# Patient Record
Sex: Female | Born: 1970
Health system: Southern US, Community
[De-identification: ages and names within clinical notes are randomized; demographics above are authoritative.]

## PROBLEM LIST (undated history)

## (undated) DIAGNOSIS — Z1371 Encounter for nonprocreative screening for genetic disease carrier status: Secondary | ICD-10-CM

## (undated) DIAGNOSIS — D352 Benign neoplasm of pituitary gland: Secondary | ICD-10-CM

## (undated) DIAGNOSIS — E221 Hyperprolactinemia: Secondary | ICD-10-CM

## (undated) DIAGNOSIS — Z803 Family history of malignant neoplasm of breast: Secondary | ICD-10-CM

## (undated) DIAGNOSIS — Z836 Family history of other diseases of the respiratory system: Secondary | ICD-10-CM

## (undated) DIAGNOSIS — K219 Gastro-esophageal reflux disease without esophagitis: Secondary | ICD-10-CM

## (undated) DIAGNOSIS — I10 Essential (primary) hypertension: Secondary | ICD-10-CM

## (undated) DIAGNOSIS — S2220XA Unspecified fracture of sternum, initial encounter for closed fracture: Secondary | ICD-10-CM

## (undated) HISTORY — PX: TUBAL LIGATION: SHX77

## (undated) HISTORY — DX: Family history of malignant neoplasm of breast: Z80.3

## (undated) HISTORY — DX: Gastro-esophageal reflux disease without esophagitis: K21.9

---
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
## 2001-02-17 ENCOUNTER — Emergency Department (HOSPITAL_COMMUNITY): Admission: EM | Admit: 2001-02-17 | Discharge: 2001-02-17 | Payer: Self-pay | Admitting: *Deleted

## 2001-07-15 ENCOUNTER — Emergency Department (HOSPITAL_COMMUNITY): Admission: EM | Admit: 2001-07-15 | Discharge: 2001-07-16 | Payer: Self-pay | Admitting: Emergency Medicine

## 2001-07-15 ENCOUNTER — Encounter: Payer: Self-pay | Admitting: Emergency Medicine

## 2001-12-05 ENCOUNTER — Encounter: Admission: RE | Admit: 2001-12-05 | Discharge: 2001-12-05 | Payer: Self-pay | Admitting: Cardiology

## 2001-12-05 ENCOUNTER — Encounter: Payer: Self-pay | Admitting: Cardiology

## 2003-07-02 ENCOUNTER — Encounter: Admission: RE | Admit: 2003-07-02 | Discharge: 2003-07-02 | Payer: Self-pay | Admitting: Cardiology

## 2003-09-26 ENCOUNTER — Emergency Department (HOSPITAL_COMMUNITY): Admission: EM | Admit: 2003-09-26 | Discharge: 2003-09-26 | Payer: Self-pay

## 2003-09-27 ENCOUNTER — Emergency Department (HOSPITAL_COMMUNITY): Admission: EM | Admit: 2003-09-27 | Discharge: 2003-09-27 | Payer: Self-pay

## 2004-05-10 ENCOUNTER — Emergency Department (HOSPITAL_COMMUNITY): Admission: EM | Admit: 2004-05-10 | Discharge: 2004-05-10 | Payer: Self-pay | Admitting: Family Medicine

## 2004-09-05 ENCOUNTER — Encounter: Admission: RE | Admit: 2004-09-05 | Discharge: 2004-09-05 | Payer: Self-pay | Admitting: Obstetrics

## 2006-12-22 ENCOUNTER — Emergency Department (HOSPITAL_COMMUNITY): Admission: EM | Admit: 2006-12-22 | Discharge: 2006-12-22 | Payer: Self-pay | Admitting: Family Medicine

## 2007-01-29 ENCOUNTER — Emergency Department (HOSPITAL_COMMUNITY): Admission: EM | Admit: 2007-01-29 | Discharge: 2007-01-29 | Payer: Self-pay | Admitting: Emergency Medicine

## 2008-02-03 ENCOUNTER — Ambulatory Visit: Payer: Self-pay | Admitting: Gastroenterology

## 2008-02-03 ENCOUNTER — Encounter: Admission: RE | Admit: 2008-02-03 | Discharge: 2008-02-03 | Payer: Self-pay | Admitting: Internal Medicine

## 2008-02-03 DIAGNOSIS — K625 Hemorrhage of anus and rectum: Secondary | ICD-10-CM | POA: Insufficient documentation

## 2008-02-03 DIAGNOSIS — K5900 Constipation, unspecified: Secondary | ICD-10-CM | POA: Insufficient documentation

## 2008-02-04 LAB — CONVERTED CEMR LAB
ALT: 16 U/L
AST: 18 U/L
Albumin: 3.7 g/dL
Alkaline Phosphatase: 55 U/L
BUN: 14 mg/dL
Basophils Absolute: 0.1 10*3/uL
Basophils Relative: 1.6 %
CO2: 37 meq/L — ABNORMAL HIGH
Calcium: 8.8 mg/dL
Chloride: 98 meq/L
Creatinine, Ser: 1.1 mg/dL
Eosinophils Absolute: 0.1 10*3/uL
Eosinophils Relative: 1.2 %
GFR calc Af Amer: 71 mL/min
GFR calc non Af Amer: 59 mL/min
Glucose, Bld: 98 mg/dL
HCT: 35 % — ABNORMAL LOW
Hemoglobin: 11.7 g/dL — ABNORMAL LOW
Lymphocytes Relative: 35.5 %
MCHC: 33.5 g/dL
MCV: 90.2 fL
Monocytes Absolute: 0.6 10*3/uL
Monocytes Relative: 7.1 %
Neutro Abs: 4.3 10*3/uL
Neutrophils Relative %: 54.6 %
Platelets: 372 10*3/uL
Potassium: 3.4 meq/L — ABNORMAL LOW
RBC: 3.88 M/uL
RDW: 12.7 %
Sodium: 141 meq/L
TSH: 0.92 u[IU]/mL
Total Bilirubin: 0.5 mg/dL
Total Protein: 7.2 g/dL
WBC: 7.9 10*3/uL

## 2008-02-07 ENCOUNTER — Encounter: Payer: Self-pay | Admitting: Gastroenterology

## 2008-02-07 ENCOUNTER — Ambulatory Visit: Payer: Self-pay | Admitting: Gastroenterology

## 2008-02-11 ENCOUNTER — Encounter: Payer: Self-pay | Admitting: Gastroenterology

## 2010-03-09 ENCOUNTER — Ambulatory Visit (HOSPITAL_BASED_OUTPATIENT_CLINIC_OR_DEPARTMENT_OTHER)
Admission: RE | Admit: 2010-03-09 | Discharge: 2010-03-09 | Payer: Self-pay | Source: Home / Self Care | Admitting: Internal Medicine

## 2010-03-13 ENCOUNTER — Ambulatory Visit: Payer: Self-pay | Admitting: Internal Medicine

## 2010-03-25 ENCOUNTER — Emergency Department (HOSPITAL_COMMUNITY)
Admission: EM | Admit: 2010-03-25 | Discharge: 2010-03-25 | Payer: Self-pay | Source: Home / Self Care | Admitting: Family Medicine

## 2010-07-31 ENCOUNTER — Encounter: Payer: Self-pay | Admitting: Orthopaedic Surgery

## 2010-08-01 ENCOUNTER — Encounter: Payer: Self-pay | Admitting: Internal Medicine

## 2010-08-19 ENCOUNTER — Inpatient Hospital Stay (INDEPENDENT_AMBULATORY_CARE_PROVIDER_SITE_OTHER)
Admission: RE | Admit: 2010-08-19 | Discharge: 2010-08-19 | Disposition: A | Discharge status: Home / Self Care | Payer: Self-pay | Source: Ambulatory Visit | Attending: Family Medicine | Admitting: Family Medicine

## 2010-08-19 DIAGNOSIS — K2990 Gastroduodenitis, unspecified, without bleeding: Secondary | ICD-10-CM

## 2010-08-19 DIAGNOSIS — I10 Essential (primary) hypertension: Secondary | ICD-10-CM

## 2010-08-19 DIAGNOSIS — K2970 Gastritis, unspecified, without bleeding: Secondary | ICD-10-CM

## 2010-08-22 ENCOUNTER — Inpatient Hospital Stay (INDEPENDENT_AMBULATORY_CARE_PROVIDER_SITE_OTHER)
Admission: RE | Admit: 2010-08-22 | Discharge: 2010-08-22 | Disposition: A | Discharge status: Home / Self Care | Payer: Self-pay | Source: Ambulatory Visit | Attending: Emergency Medicine | Admitting: Emergency Medicine

## 2010-08-22 DIAGNOSIS — N898 Other specified noninflammatory disorders of vagina: Secondary | ICD-10-CM

## 2010-08-22 LAB — POCT URINALYSIS DIPSTICK
Bilirubin Urine: NEGATIVE
Nitrite: NEGATIVE
Urine Glucose, Fasting: NEGATIVE mg/dL
Urobilinogen, UA: 0.2 mg/dL (ref 0.0–1.0)

## 2010-08-22 LAB — WET PREP, GENITAL
Trich, Wet Prep: NONE SEEN
Yeast Wet Prep HPF POC: NONE SEEN

## 2010-08-23 LAB — GC/CHLAMYDIA PROBE AMP, GENITAL
Chlamydia, DNA Probe: NEGATIVE
GC Probe Amp, Genital: NEGATIVE

## 2010-12-09 ENCOUNTER — Inpatient Hospital Stay (INDEPENDENT_AMBULATORY_CARE_PROVIDER_SITE_OTHER)
Admission: RE | Admit: 2010-12-09 | Discharge: 2010-12-09 | Disposition: A | Discharge status: Home / Self Care | Payer: Self-pay | Source: Ambulatory Visit | Attending: Family Medicine | Admitting: Family Medicine

## 2010-12-09 DIAGNOSIS — R071 Chest pain on breathing: Secondary | ICD-10-CM

## 2011-03-25 ENCOUNTER — Emergency Department (HOSPITAL_COMMUNITY): Payer: No Typology Code available for payment source

## 2011-03-25 ENCOUNTER — Emergency Department (HOSPITAL_COMMUNITY)
Admission: EM | Admit: 2011-03-25 | Discharge: 2011-03-25 | Disposition: A | Discharge status: Home / Self Care | Payer: No Typology Code available for payment source | Attending: Emergency Medicine | Admitting: Emergency Medicine

## 2011-03-25 DIAGNOSIS — M25519 Pain in unspecified shoulder: Secondary | ICD-10-CM | POA: Insufficient documentation

## 2011-03-25 DIAGNOSIS — R0602 Shortness of breath: Secondary | ICD-10-CM | POA: Insufficient documentation

## 2011-03-25 DIAGNOSIS — I10 Essential (primary) hypertension: Secondary | ICD-10-CM | POA: Insufficient documentation

## 2011-03-25 DIAGNOSIS — R079 Chest pain, unspecified: Secondary | ICD-10-CM | POA: Insufficient documentation

## 2011-03-28 ENCOUNTER — Inpatient Hospital Stay (INDEPENDENT_AMBULATORY_CARE_PROVIDER_SITE_OTHER)
Admission: RE | Admit: 2011-03-28 | Discharge: 2011-03-28 | Disposition: A | Discharge status: Home / Self Care | Payer: No Typology Code available for payment source | Source: Ambulatory Visit | Attending: Family Medicine | Admitting: Family Medicine

## 2011-03-28 DIAGNOSIS — S139XXA Sprain of joints and ligaments of unspecified parts of neck, initial encounter: Secondary | ICD-10-CM

## 2011-06-12 ENCOUNTER — Emergency Department: Payer: Self-pay | Admitting: Emergency Medicine

## 2011-06-12 ENCOUNTER — Emergency Department (HOSPITAL_COMMUNITY)
Admission: EM | Admit: 2011-06-12 | Discharge: 2011-06-13 | Discharge status: Against Medical Advice | Payer: Self-pay | Attending: Emergency Medicine | Admitting: Emergency Medicine

## 2011-06-12 DIAGNOSIS — R109 Unspecified abdominal pain: Secondary | ICD-10-CM | POA: Insufficient documentation

## 2011-06-12 NOTE — ED Notes (Signed)
To ed for eval of abd pain. States she has not had BM in 4 days. Now with vaginal bleed. States her menses is irregular.

## 2011-10-15 ENCOUNTER — Emergency Department (HOSPITAL_BASED_OUTPATIENT_CLINIC_OR_DEPARTMENT_OTHER)
Admission: EM | Admit: 2011-10-15 | Discharge: 2011-10-15 | Disposition: A | Discharge status: Home / Self Care | Payer: Self-pay | Attending: Emergency Medicine | Admitting: Emergency Medicine

## 2011-10-15 ENCOUNTER — Encounter (HOSPITAL_BASED_OUTPATIENT_CLINIC_OR_DEPARTMENT_OTHER): Payer: Self-pay | Admitting: Emergency Medicine

## 2011-10-15 DIAGNOSIS — S1096XA Insect bite of unspecified part of neck, initial encounter: Secondary | ICD-10-CM | POA: Insufficient documentation

## 2011-10-15 DIAGNOSIS — T7840XA Allergy, unspecified, initial encounter: Secondary | ICD-10-CM | POA: Insufficient documentation

## 2011-10-15 DIAGNOSIS — W57XXXA Bitten or stung by nonvenomous insect and other nonvenomous arthropods, initial encounter: Secondary | ICD-10-CM | POA: Insufficient documentation

## 2011-10-15 DIAGNOSIS — I10 Essential (primary) hypertension: Secondary | ICD-10-CM | POA: Insufficient documentation

## 2011-10-15 DIAGNOSIS — Y9229 Other specified public building as the place of occurrence of the external cause: Secondary | ICD-10-CM | POA: Insufficient documentation

## 2011-10-15 HISTORY — DX: Essential (primary) hypertension: I10

## 2011-10-15 MED ORDER — PREDNISONE 50 MG PO TABS
60.0000 mg | ORAL_TABLET | Freq: Once | ORAL | Status: AC
  Administered 2011-10-15: 60 mg via ORAL
  Filled 2011-10-15: qty 1

## 2011-10-15 MED ORDER — DIPHENHYDRAMINE HCL 50 MG/ML IJ SOLN
25.0000 mg | Freq: Once | INTRAMUSCULAR | Status: AC
  Administered 2011-10-15: 25 mg via INTRAMUSCULAR

## 2011-10-15 MED ORDER — DIPHENHYDRAMINE HCL 25 MG PO CAPS: 25.0000 mg | ORAL_CAPSULE | Freq: Four times a day (QID) | ORAL | Status: DC | PRN

## 2011-10-15 MED ORDER — PREDNISONE 10 MG PO TABS: 20.0000 mg | ORAL_TABLET | Freq: Every day | ORAL | Status: DC

## 2011-10-15 MED ORDER — DIPHENHYDRAMINE HCL 50 MG/ML IJ SOLN
25.0000 mg | Freq: Once | INTRAMUSCULAR | Status: DC
  Filled 2011-10-15: qty 1

## 2011-10-15 NOTE — Discharge Instructions (Signed)
Bedbugs Bedbugs are tiny bugs that live in and around beds. During the day, they hide in mattresses and other places near beds. They come out at night and bite people lying in bed. They need blood to live and grow. Bedbugs can be found in beds anywhere. Usually, they are found in places where many people come and go (hotels, shelters, hospitals). It does not matter whether the place is dirty or clean. Getting bitten by bedbugs rarely causes a medical problem. The biggest problem can be getting rid of them. This often takes the work of a pest control expert. CAUSES  Less use of pesticides. Bedbugs were common before the 1950s. Then, [place] pesticides such as DDT nearly wiped them out. Today, these pesticides are not used because they harm the environment and can cause health problems.   More travel. Besides mattresses, bedbugs can also live in clothing and luggage. They can come along as people travel from place to place. Bedbugs are more common in certain parts of the world. When people travel to those areas, the bugs can come home with them.   Presence of birds and bats. Bedbugs often infest birds and bats. If you have these animals in or near your home, bedbugs may infest your house, too.  SYMPTOMS It does not hurt to be bitten by a bedbug. You will probably not wake up when you are bitten. Bedbugs usually bite areas of the skin that are not covered. Symptoms may show when you wake up, or they may take a day or more to show up. Symptoms may include:  Small red bumps on the skin. These might be lined up in a row or clustered in a group.   A darker red dot in the middle of red bumps.   Blisters on the skin. There may be swelling and very bad itching. These may be signs of an allergic reaction. This does not happen often.  DIAGNOSIS Bedbug bites might look and feel like other types of insect bites. The bugs do not stay on the body like ticks or lice. They bite, drop off, and crawl away to hide.  Your caregiver will probably:  Ask about your symptoms.   Ask about your recent activities and travel.   Check your skin for bedbug bites.   Ask you to check at home for signs of bedbugs. You should look for:   Spots or stains on the bed or nearby. This could be from bedbugs that were crushed or from their eggs or waste.   Bedbugs themselves. They are reddish-brown, oval, and flat. They do not fly. They are about the size of an apple seed.   Places to look for bedbugs include:   Beds. Check mattresses, headboards, box springs, and bed frames.   On drapes and curtains near the bed.   Under carpeting in the bedroom.   Behind electrical outlets.   Behind any wallpaper that is peeling.   Inside luggage.  TREATMENT Most bedbug bites do not need treatment. They usually go away on their own in a few days. The bites are not dangerous. However, treatment may be needed if you have scratched so much that your skin has become infected. You may also need treatment if you are allergic to bedbug bites. Treatment options include:  A drug that stops swelling and itching (corticosteroid). Usually, a cream is rubbed on the skin. If you have a bad rash, you may be given a corticosteroid pill.   Oral antihistamines. These are   pills to help control itching.   Antibiotic medicines. An antibiotic may be prescribed for infected skin.  HOME CARE INSTRUCTIONS   Take any medicine prescribed by your caregiver for your bites. Follow the directions carefully.   Consider wearing pajamas with long sleeves and pant legs.   Your bedroom may need to be treated. A pest control expert should make sure the bedbugs are gone. You may need to throw away mattresses or luggage. Ask the pest control expert what you can do to keep the bedbugs from coming back. Common suggestions include:   Putting a plastic cover over your mattress.   Washing and drying your clothes and bedding in hot water and a hot dryer. The  temperature should be hotter than 120 F (48.9 C). Bedbugs are killed by high temperatures.   Vacuuming carefully all around your bed. Vacuum in all cracks and crevices where the bugs might hide. Do this often.   Carefully checking all used furniture, bedding, or clothes that you bring into your house.   Eliminating bird nests and bat roosts.   If you get bedbug bites when traveling, check all your possessions carefully before bringing them into your house. If you find any bugs on clothes or in your luggage, consider throwing those items away.  SEEK MEDICAL CARE IF:  You have red bug bites that keep coming back.   You have red bug bites that itch badly.   You have bug bites that cause a skin rash.   You have scratch marks that are red and sore.  SEEK IMMEDIATE MEDICAL CARE IF: You have a fever. Document Released: 07/29/2010 Document Revised: 06/15/2011 Document Reviewed: 07/29/2010 [facility] Patient Information 2012 [place], [place].Allergic Reaction, Mild to Moderate Allergies may happen from anything your body is sensitive to. This may be food, medications, pollens, chemicals, and nearly anything around you in everyday life that produces allergens. An allergen is anything that causes an allergy producing substance. Allergens cause your body to release allergic antibodies. Through a chain of events, they cause a release of histamine into the blood stream. Histamines are meant to protect you, but they also cause your discomfort. This is why antihistamines are often used for allergies. Heredity is often a factor in causing allergic reactions. This means you may have some of the same allergies as your parents. Allergies happen in all age groups. You may have some idea of what caused your reaction. There are many allergens around [place]. It may be difficult to know what caused your reaction. If this is a first time event, it may never happen again. Allergies cannot be cured but can be controlled with  medications. SYMPTOMS  You may get some or all of the following problems from allergies.  Swelling and itching in and around the mouth.   Tearing, itchy eyes.   Nasal congestion and runny nose.   Sneezing and coughing.   An itchy red rash or hives.   Vomiting or diarrhea.   Difficulty breathing.  Seasonal allergies occur in all age groups. They are seasonal because they usually occur during the same season every year. They may be a reaction to molds, grass pollens, or tree pollens. Other causes of allergies are house dust mite allergens, pet dander and mold spores. These are just a common few of the thousands of allergens around [place]. All of the symptoms listed above happen when you come in contact with pollens and other allergens. Seasonal allergies are usually not life threatening. They are generally more  of a nuisance that can often be handled using medications. Hay fever is a combination of all or some of the above listed allergy problems. It may often be treated with simple over-the-counter medications such as diphenhydramine. Take medication as directed. Check with your caregiver or package insert for child dosages. TREATMENT AND HOME CARE INSTRUCTIONS If hives or rash are present:  Take medications as directed.   You may use an over-the-counter antihistamine (diphenhydramine) for hives and itching as needed. Do not drive or drink alcohol until medications used to treat the reaction have worn off. Antihistamines tend to make people sleepy.   Apply cold cloths (compresses) to the skin or take baths in cool water. This will help itching. Avoid hot baths or showers. Heat will make a rash and itching worse.   If your allergies persist and become more severe, and over the counter medications are not effective, there are many new medications your caretaker can prescribe. Immunotherapy or desensitizing injections can be used if all else fails. Follow up with your caregiver if problems  continue.  SEEK MEDICAL CARE IF:   Your allergies are becoming progressively more troublesome.   You suspect a food allergy. Symptoms generally happen within 30 minutes of eating a food.   Your symptoms have not gone away within 2 days or are getting worse.   You develop new symptoms.   You want to retest yourself or your child with a food or drink you think causes an allergic reaction. Never test yourself or your child of a suspected allergy without being under the watchful eye of your caregivers. A second exposure to an allergen may be life-threatening.  SEEK IMMEDIATE MEDICAL CARE IF:  You develop difficulty breathing or wheezing, or have a tight feeling in your chest or throat.   You develop a swollen mouth, hives, swelling, or itching all over your body.  A severe reaction with any of the above problems should be considered life-threatening. If you suddenly develop difficulty breathing call for local emergency medical help. THIS IS AN EMERGENCY. MAKE SURE YOU:   Understand these instructions.   Will watch your condition.   Will get help right away if you are not doing well or get worse.  Document Released: 04/23/2007 Document Revised: 06/15/2011 Document Reviewed: 04/23/2007 [facility] Patient Information 2012 [place], [place].Insect Bite Mosquitoes, flies, fleas, bedbugs, and many other insects can bite. Insect bites are different from insect stings. A sting is when venom is injected into the skin. Some insect bites can transmit infectious diseases. SYMPTOMS  Insect bites usually turn red, swell, and itch for 2 to 4 days. They often go away on their own. TREATMENT  Your caregiver may prescribe antibiotic medicines if a bacterial infection develops in the bite. HOME CARE INSTRUCTIONS  Do not scratch the bite area.   Keep the bite area clean and dry. Wash the bite area thoroughly with soap and water.   Put ice or cool compresses on the bite area.   Put ice in a plastic bag.     Place a towel between your skin and the bag.   Leave the ice on for 20 minutes, 4 times a day for the first 2 to 3 days, or as directed.   You may apply a baking soda paste, cortisone cream, or calamine lotion to the bite area as directed by your caregiver. This can help reduce itching and swelling.   Only take over-the-counter or prescription medicines as directed by your caregiver.   If  you are given antibiotics, take them as directed. Finish them even if you start to feel better.  You may need a tetanus shot if:  You cannot remember when you had your last tetanus shot.   You have never had a tetanus shot.   The injury broke your skin.  If you get a tetanus shot, your arm may swell, get red, and feel warm to the touch. This is common and not a problem. If you need a tetanus shot and you choose not to have one, there is a rare chance of getting tetanus. Sickness from tetanus can be serious. SEEK IMMEDIATE MEDICAL CARE IF:   You have increased pain, redness, or swelling in the bite area.   You see a red line on the skin coming from the bite.   You have a fever.   You have joint pain.   You have a headache or neck pain.   You have unusual weakness.   You have a rash.   You have chest pain or shortness of breath.   You have abdominal pain, nausea, or vomiting.   You feel unusually tired or sleepy.  MAKE SURE YOU:   Understand these instructions.   Will watch your condition.   Will get help right away if you are not doing well or get worse.  Document Released: 08/03/2004 Document Revised: 06/15/2011 Document Reviewed: 01/25/2011 [facility] Patient Information 2012 [place], [place].Insect Sting Allergy An insect sting can cause pain, redness, and itching at the sting site. Symptoms of an allergic reaction are usually contained in the area of the sting site (localized). An allergic reaction usually occurs within minutes of an insect sting. Redness and swelling of the sting  site may last as long as 1 week. SYMPTOMS   A local reaction at the sting site can cause:   Pain.   Redness.   Itching.   Swelling.   A systemic reaction can cause a reaction anywhere on your body. For example, you may develop the following:   Hives.   Generalized swelling.   Body aches.   Itching.   Dizziness.   Nausea or vomiting.   A more serious (anaphylactic) reaction can involve:   Difficulty breathing or wheezing.   Tongue or throat swelling.   Fainting.  HOME CARE INSTRUCTIONS   If you are stung, look to see if the stinger is still in the skin. This can appear as a small, black dot at the sting site. The stinger can be removed by scraping it with a dull object such as a credit card or your fingernail. Do not use tweezers. Tweezers can squeeze the stinger and release more insect venom into the skin.   After the stinger has been removed, wash the sting site with soap and water or rubbing alcohol.   Put ice on the sting area.   Put ice in a plastic bag.   Place a towel between your skin and the bag.   Leave the ice on for 15 to 20 minutes, 3 to 4 times a day.   You can use a topical anti-itch cream, such as hydrocortisone cream, to help reduce itching.   You can take an oral antihistamine medicine to help decrease swelling and other symptoms.   Only take over-the-counter or prescription medicines for pain, discomfort, or fever as directed by your caregiver.   If prescribed, keep an epinephrine injection to temporarily treat emergency allergic reactions with you at all times. It is important to know how  and when to give an epinephrine injection.   Avoid contact with stinging insects or the insect thought to have caused your reaction.   Wear long pants when mowing grass or hiking. Wear gloves when gardening.   Use unscented deodorant and avoid [place] perfumes when outdoors.   Wear a medical alert bracelet or necklace that describes your allergies.    Make sure your primary caregiver has a record of your insect sting reaction.   It may be helpful to consult with an allergy specialist. You may have other sensitivities that you are not aware of.  SEEK IMMEDIATE MEDICAL CARE IF:  You experience wheezing or difficulty breathing.   You have difficulty swallowing, or you develop throat tightness.   You have mouth, tongue, or throat swelling.   You feel weak, or you faint.   You have coughing or a change in your voice.   You experience vomiting, diarrhea, or stomach cramps.   You have chest pain or lightheadedness.   You notice raised, red patches on the skin that itch.  These may be early warning signs of a serious generalized or anaphylactic reaction. Call your local emergency services (911 in [place] immediately. MAKE SURE YOU:   Understand these instructions.   Will watch your condition.   Will get help right away if you are not doing well or get worse.  FOR MORE INFORMATION American Academy of Allergy Asthma and Immunology: www.aaaai.org [employer]: www.acaai.org Document Released: 05/25/2006 Document Revised: 06/15/2011 Document Reviewed: 07/06/2009 [facility] Patient Information 2012 [place], [place].

## 2011-10-15 NOTE — ED Provider Notes (Signed)
History     CSN: [id number]  Arrival date & time 10/15/11  1235   First MD Initiated Contact with Patient 10/15/11 1314      Chief Complaint  Patient presents with  . Eye Problem  . Allergic Reaction    (Consider location/radiation/quality/duration/timing/severity/associated sxs/prior treatment) HPI  Pt presents to the ED with complaints of bug bites and rash. She states that she spent the night at the hotel on Thursday night and was bit multiple times on her face, right arms, side and legs by unknown insect.  She was seen Friday at [facility] and given Medrol and Solu-Medrol shot. She is given a prescription for prednisone but did not get it filled because she did not know what it was. She took one antibiotic pill at home to see if it would help. She states that she has had much improvement since being seen on Friday and has not taken any Benadryl or prednisone since leaving the urgent care. She denies change of vision, fevers, weakness, nausea, vomiting, diarrhea, lethargy, syncope. But admits to her eyes being very itchy. She states that the rest of her body isn't that itchy anymore. She denies coming to the ER for any other reasons at this time  Past Medical History  Diagnosis Date  . Hypertension     Past Surgical History  Procedure Date  . Tubal ligation     History reviewed. No pertinent family history.  History  Substance Use Topics  . Smoking status: Not on file  . Smokeless tobacco: Not on file  . Alcohol Use:     OB History    Grav Para Term Preterm Abortions TAB SAB Ect Mult Living                  Review of Systems  All other systems reviewed and are negative.    Allergies  Review of patient's allergies indicates no known allergies.  Home Medications  No current outpatient prescriptions on file.  BP 140/95  Pulse 67  Temp(Src) 97.7 F (36.5 C) (Oral)  Resp 16  Ht 5\' 8"  (1.727 m)  Wt 184 lb (83.462 kg)  BMI 27.98 kg/m2  SpO2 100%   LMP 07/17/2011  Physical Exam  Nursing note and vitals reviewed. Constitutional: She appears well-developed and well-nourished. No distress.  HENT:  Head: Normocephalic and atraumatic.    Eyes: Conjunctivae and EOM are normal. Pupils are equal, round, and reactive to light. Left eye exhibits no discharge, no exudate and no hordeolum. No foreign body present in the left eye.         Swelling of upper eyelid  Neck: Normal range of motion. Neck supple.  Cardiovascular: Normal rate and regular rhythm.   Pulmonary/Chest: Effort normal.  Abdominal: Soft.  Neurological: She is alert.  Skin: Skin is warm and dry.    ED Course  Procedures (including critical care time)  Labs Reviewed - No data to display No results found.   1. Allergic reaction   2. Bug bites       MDM  Pt given IM benadryl and Prednisone 60mg  PO in ED. Pt discharged with Rx for Prednisone. I do not suspect infection, pt did has not taken any Benadryl or Prednisone since leaving [facility] on Friday. The swelling has improved significantly in comparison to pictures pt shows me from Friday. Pt given strict return to ED precautions.  Pt has been advised of the symptoms that warrant their return to the ED. Patient has  voiced understanding and has agreed to follow-up with the PCP or specialist.         Dorthula Matas, PA 10/15/11 1349

## 2011-10-15 NOTE — ED Notes (Signed)
Pt has allergic reaction, generalized rash on arms and chest and face.  Pt has swollen left eye.  Pt states she was seen at [facility] on Friday, was given benadryl and solumedrol.  Pt given prescription for prednisone but has not gotten it filled.  Pt continues to have itching and swelling to left eye.  Pt told to go to ER for evaluation of left eye on Friday.

## 2011-10-15 NOTE — ED Provider Notes (Signed)
Medical screening examination/treatment/procedure(s) were performed by non-physician practitioner and as supervising physician I was immediately available for consultation/collaboration.   Eris Hannan, MD 10/15/11 1512 

## 2013-01-14 ENCOUNTER — Emergency Department (HOSPITAL_COMMUNITY)
Admission: EM | Admit: 2013-01-14 | Discharge: 2013-01-15 | Disposition: A | Discharge status: Home / Self Care | Payer: Medicaid Other | Attending: Emergency Medicine | Admitting: Emergency Medicine

## 2013-01-14 ENCOUNTER — Emergency Department (HOSPITAL_COMMUNITY): Payer: Medicaid Other

## 2013-01-14 ENCOUNTER — Encounter (HOSPITAL_COMMUNITY): Payer: Self-pay | Admitting: *Deleted

## 2013-01-14 DIAGNOSIS — R110 Nausea: Secondary | ICD-10-CM | POA: Insufficient documentation

## 2013-01-14 DIAGNOSIS — IMO0001 Reserved for inherently not codable concepts without codable children: Secondary | ICD-10-CM | POA: Insufficient documentation

## 2013-01-14 DIAGNOSIS — R5381 Other malaise: Secondary | ICD-10-CM | POA: Insufficient documentation

## 2013-01-14 DIAGNOSIS — R209 Unspecified disturbances of skin sensation: Secondary | ICD-10-CM | POA: Insufficient documentation

## 2013-01-14 DIAGNOSIS — R200 Anesthesia of skin: Secondary | ICD-10-CM

## 2013-01-14 DIAGNOSIS — Z3202 Encounter for pregnancy test, result negative: Secondary | ICD-10-CM | POA: Insufficient documentation

## 2013-01-14 DIAGNOSIS — F17200 Nicotine dependence, unspecified, uncomplicated: Secondary | ICD-10-CM | POA: Insufficient documentation

## 2013-01-14 DIAGNOSIS — R071 Chest pain on breathing: Secondary | ICD-10-CM | POA: Insufficient documentation

## 2013-01-14 DIAGNOSIS — R5383 Other fatigue: Secondary | ICD-10-CM | POA: Insufficient documentation

## 2013-01-14 DIAGNOSIS — G4700 Insomnia, unspecified: Secondary | ICD-10-CM | POA: Insufficient documentation

## 2013-01-14 DIAGNOSIS — R079 Chest pain, unspecified: Secondary | ICD-10-CM

## 2013-01-14 DIAGNOSIS — R52 Pain, unspecified: Secondary | ICD-10-CM

## 2013-01-14 DIAGNOSIS — R42 Dizziness and giddiness: Secondary | ICD-10-CM | POA: Insufficient documentation

## 2013-01-14 DIAGNOSIS — I10 Essential (primary) hypertension: Secondary | ICD-10-CM | POA: Insufficient documentation

## 2013-01-14 DIAGNOSIS — Z8781 Personal history of (healed) traumatic fracture: Secondary | ICD-10-CM | POA: Insufficient documentation

## 2013-01-14 HISTORY — DX: Unspecified fracture of sternum, initial encounter for closed fracture: S22.20XA

## 2013-01-14 LAB — CBC
Hemoglobin: 10.2 g/dL — ABNORMAL LOW (ref 12.0–15.0)
RBC: 4.14 MIL/uL (ref 3.87–5.11)

## 2013-01-14 LAB — BASIC METABOLIC PANEL
CO2: 28 mEq/L (ref 19–32)
Chloride: 101 mEq/L (ref 96–112)
Glucose, Bld: 96 mg/dL (ref 70–99)
Potassium: 3.5 mEq/L (ref 3.5–5.1)
Sodium: 136 mEq/L (ref 135–145)

## 2013-01-14 NOTE — ED Notes (Signed)
Chest tightness, feeling cold, numbness and tingling in extreme ties.

## 2013-01-15 LAB — POCT I-STAT TROPONIN I: Troponin i, poc: 0.01 ng/mL (ref 0.00–0.08)

## 2013-01-15 MED ORDER — ONDANSETRON 4 MG PO TBDP
4.0000 mg | ORAL_TABLET | Freq: Once | ORAL | Status: AC
  Administered 2013-01-15: 4 mg via ORAL
  Filled 2013-01-15: qty 1

## 2013-01-15 NOTE — ED Provider Notes (Signed)
History    CSN: [id number] Arrival date & time 01/14/13  2218  First MD Initiated Contact with Patient 01/15/13 0002     Chief Complaint  Patient presents with  . Chest Pain  . Numbness  . Tingling   (Consider location/radiation/quality/duration/timing/severity/associated sxs/prior Treatment) HPI Pt is a 42yo female with hx of chest pain that started 5 days ago, comes and goes, 5/10, sharp and heavy, worse in left side of chest.  Nothing makes pain better or worse.  Has not tried any pain medications.  No known cardiopulonary hx.  Pt also reports insomnia for several months and working 3rd shift for several months.  Today she noticed increased numbness in her fingers and toes with some nausea.  Denies diaphoresis, SOB, fever, vomiting or diarrhea.  Does report consuming soda, "5-hour energy" and coffee throughout the day to help her stay awake.    Past Medical History  Diagnosis Date  . Hypertension   . Sternum fx    Past Surgical History  Procedure Laterality Date  . Tubal ligation     No family history on file. History  Substance Use Topics  . Smoking status: Current Every Day Smoker  . Smokeless tobacco: Not on file  . Alcohol Use: Yes   OB History   Grav Para Term Preterm Abortions TAB SAB Ect Mult Living                 Review of Systems  Constitutional: Positive for fatigue. Negative for fever, chills, diaphoresis and appetite change.  Respiratory: Negative for cough and shortness of breath.   Cardiovascular: Positive for chest pain.  Gastrointestinal: Positive for nausea. Negative for vomiting, abdominal pain and diarrhea.  Neurological: Positive for dizziness, weakness, light-headedness and numbness ( fingers and toes).  All other systems reviewed and are negative.    Allergies  Review of patient's allergies indicates no known allergies.  Home Medications  No current outpatient prescriptions on file. BP 157/101  Pulse 65  Temp(Src) 98 F (36.7 C)  (Oral)  Resp 13  SpO2 100%  LMP 12/25/2012 Physical Exam  Nursing note and vitals reviewed. Constitutional: She is oriented to person, place, and time. She appears well-developed and well-nourished. No distress.  Pt appears fatigued. NAD.  HENT:  Head: Normocephalic and atraumatic.  Eyes: Conjunctivae and EOM are normal. Pupils are equal, round, and reactive to light. Right eye exhibits no discharge. Left eye exhibits no discharge. No scleral icterus.  Neck: Normal range of motion. Neck supple.  Cardiovascular: Normal rate, regular rhythm and normal heart sounds.   Pulmonary/Chest: Effort normal and breath sounds normal. No respiratory distress. She has no wheezes. She has no rales. She exhibits no tenderness.  Abdominal: Soft. Bowel sounds are normal. She exhibits no distension and no mass. There is no tenderness. There is no rebound and no guarding.  Musculoskeletal: Normal range of motion.  Neurological: She is alert and oriented to person, place, and time. She has normal strength. No cranial nerve deficit or sensory deficit. She displays a negative Romberg sign. Coordination and gait normal. GCS eye subscore is 4. GCS verbal subscore is 5. GCS motor subscore is 6.  CN II-XII in tact, no focal deficit, nl finger to nose coordination. Nl sensation, 5/5 strength in all major muscle groups. Neg romberg and nl gait.    Skin: Skin is warm and dry. She is not diaphoretic.    ED Course  Procedures (including critical care time) Labs Reviewed  CBC - Abnormal;  Notable for the following:    Hemoglobin 10.2 (*)    HCT 32.3 (*)    MCH 24.6 (*)    RDW 16.7 (*)    All other components within normal limits  BASIC METABOLIC PANEL - Abnormal; Notable for the following:    GFR calc non Af Amer 70 (*)    GFR calc Af Amer 81 (*)    All other components within normal limits  POCT PREGNANCY, URINE  POCT I-STAT TROPONIN I   Dg Chest 2 View  01/14/2013   *RADIOLOGY REPORT*  Clinical Data: Chest  pain.  Numbness and tingling of the right hand and right foot.  Smoker.  CHEST - 2 VIEW  Comparison: 03/25/2011  Findings: The heart size and pulmonary vascularity are normal. The lungs appear clear and expanded without focal air space disease or consolidation. No blunting of the costophrenic angles.  No pneumothorax.  Mediastinal contours appear intact.  Old left rib fractures.  No significant changes since the previous study.  IMPRESSION: No evidence of active pulmonary disease.   Original Report Authenticated By: Burman Nieves, M.D.    Date: 01/15/2013  Rate: 68  Rhythm: normal sinus rhythm  QRS Axis: normal  Intervals: normal  ST/T Wave abnormalities: normal  Conduction Disutrbances:none  Narrative Interpretation:   Old EKG Reviewed: unchanged    1. Chest pain   2. Numbness and tingling   3. Insomnia   4. Nausea   5. Body aches     MDM  Pt has hx of insomnia for months, 5 day hx of chest pain, 1 day hx of numbness in fingers and toes.  Reports consumption of coffee, soda, and 5-hour energy throughout the day.  Works 3rd shift.  Will perform cardiac workup.  Symptoms likely due to too much caffeine intake.    Urine preg: neg. istat troponinL unremarkable CBC: unremarkable BMP: unremarkable  CXR: no evidence of active pulmonary disease  EKG: NSR, no arrythmia or STEMI  Advised pt to stop drinking and consuming caffeine.  Will discharge pt home and have her f/u with [facility] and [facility] info provided. Return precautions given. Pt verbalized understanding and agreement with tx plan. Vitals: unremarkable. Discharged in stable condition.    Discussed pt with attending during ED encounter.   Junius Finner, PA-C 01/15/13 [phone]

## 2013-01-15 NOTE — ED Provider Notes (Signed)
Medical screening examination/treatment/procedure(s) were performed by non-physician practitioner and as supervising physician I was immediately available for consultation/collaboration.  Olivia Mackie, MD 01/15/13 [phone]

## 2013-01-15 NOTE — ED Notes (Signed)
No changes from triage assessment 

## 2013-03-25 ENCOUNTER — Emergency Department (HOSPITAL_COMMUNITY)
Admission: EM | Admit: 2013-03-25 | Discharge: 2013-03-26 | Disposition: A | Discharge status: Home / Self Care | Payer: Medicaid Other | Attending: Emergency Medicine | Admitting: Emergency Medicine

## 2013-03-25 ENCOUNTER — Encounter (HOSPITAL_COMMUNITY): Payer: Self-pay | Admitting: *Deleted

## 2013-03-25 ENCOUNTER — Emergency Department (HOSPITAL_COMMUNITY): Payer: Medicaid Other

## 2013-03-25 DIAGNOSIS — S335XXA Sprain of ligaments of lumbar spine, initial encounter: Secondary | ICD-10-CM | POA: Insufficient documentation

## 2013-03-25 DIAGNOSIS — S3981XA Other specified injuries of abdomen, initial encounter: Secondary | ICD-10-CM | POA: Insufficient documentation

## 2013-03-25 DIAGNOSIS — S39012A Strain of muscle, fascia and tendon of lower back, initial encounter: Secondary | ICD-10-CM

## 2013-03-25 DIAGNOSIS — Z8781 Personal history of (healed) traumatic fracture: Secondary | ICD-10-CM | POA: Insufficient documentation

## 2013-03-25 DIAGNOSIS — S161XXA Strain of muscle, fascia and tendon at neck level, initial encounter: Secondary | ICD-10-CM

## 2013-03-25 DIAGNOSIS — R209 Unspecified disturbances of skin sensation: Secondary | ICD-10-CM | POA: Insufficient documentation

## 2013-03-25 DIAGNOSIS — Y9389 Activity, other specified: Secondary | ICD-10-CM | POA: Insufficient documentation

## 2013-03-25 DIAGNOSIS — Z9851 Tubal ligation status: Secondary | ICD-10-CM | POA: Insufficient documentation

## 2013-03-25 DIAGNOSIS — S139XXA Sprain of joints and ligaments of unspecified parts of neck, initial encounter: Secondary | ICD-10-CM | POA: Insufficient documentation

## 2013-03-25 DIAGNOSIS — I10 Essential (primary) hypertension: Secondary | ICD-10-CM | POA: Insufficient documentation

## 2013-03-25 DIAGNOSIS — Y92410 Unspecified street and highway as the place of occurrence of the external cause: Secondary | ICD-10-CM | POA: Insufficient documentation

## 2013-03-25 MED ORDER — HYDROCODONE-ACETAMINOPHEN 5-325 MG PO TABS
1.0000 | ORAL_TABLET | Freq: Once | ORAL | Status: AC
  Administered 2013-03-26: 1 via ORAL
  Filled 2013-03-25: qty 1
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
## 2013-03-26 MED ORDER — IBUPROFEN 800 MG PO TABS: 800.0000 mg | ORAL_TABLET | Freq: Three times a day (TID) | ORAL | Status: DC | PRN

## 2013-03-26 MED ORDER — HYDROCODONE-ACETAMINOPHEN 5-325 MG PO TABS: 1.0000 | ORAL_TABLET | Freq: Four times a day (QID) | ORAL | Status: DC | PRN

## 2013-03-27 NOTE — ED Provider Notes (Signed)
Medical screening examination/treatment/procedure(s) were performed by non-physician practitioner and as supervising physician I was immediately available for consultation/collaboration.  Welborn Keena, MD 03/27/13 1952 

## 2013-05-04 ENCOUNTER — Emergency Department (HOSPITAL_COMMUNITY): Payer: No Typology Code available for payment source

## 2013-05-04 ENCOUNTER — Encounter (HOSPITAL_COMMUNITY): Payer: Self-pay | Admitting: Emergency Medicine

## 2013-05-04 ENCOUNTER — Emergency Department (HOSPITAL_COMMUNITY)
Admission: EM | Admit: 2013-05-04 | Discharge: 2013-05-04 | Disposition: A | Discharge status: Home / Self Care | Payer: No Typology Code available for payment source | Attending: Emergency Medicine | Admitting: Emergency Medicine

## 2013-05-04 DIAGNOSIS — S161XXA Strain of muscle, fascia and tendon at neck level, initial encounter: Secondary | ICD-10-CM

## 2013-05-04 DIAGNOSIS — Z8781 Personal history of (healed) traumatic fracture: Secondary | ICD-10-CM | POA: Insufficient documentation

## 2013-05-04 DIAGNOSIS — Z7982 Long term (current) use of aspirin: Secondary | ICD-10-CM | POA: Insufficient documentation

## 2013-05-04 DIAGNOSIS — S139XXA Sprain of joints and ligaments of unspecified parts of neck, initial encounter: Secondary | ICD-10-CM | POA: Insufficient documentation

## 2013-05-04 DIAGNOSIS — Y92410 Unspecified street and highway as the place of occurrence of the external cause: Secondary | ICD-10-CM | POA: Insufficient documentation

## 2013-05-04 DIAGNOSIS — S0990XA Unspecified injury of head, initial encounter: Secondary | ICD-10-CM | POA: Insufficient documentation

## 2013-05-04 DIAGNOSIS — I10 Essential (primary) hypertension: Secondary | ICD-10-CM | POA: Insufficient documentation

## 2013-05-04 DIAGNOSIS — Y9389 Activity, other specified: Secondary | ICD-10-CM | POA: Insufficient documentation

## 2013-05-04 DIAGNOSIS — S46909A Unspecified injury of unspecified muscle, fascia and tendon at shoulder and upper arm level, unspecified arm, initial encounter: Secondary | ICD-10-CM | POA: Insufficient documentation

## 2013-05-04 DIAGNOSIS — Z87891 Personal history of nicotine dependence: Secondary | ICD-10-CM | POA: Insufficient documentation

## 2013-05-04 DIAGNOSIS — S4980XA Other specified injuries of shoulder and upper arm, unspecified arm, initial encounter: Secondary | ICD-10-CM | POA: Insufficient documentation

## 2013-05-04 MED ORDER — NAPROXEN 500 MG PO TABS: 500.0000 mg | ORAL_TABLET | Freq: Two times a day (BID) | ORAL | Status: DC

## 2013-05-04 MED ORDER — IBUPROFEN 800 MG PO TABS
800.0000 mg | ORAL_TABLET | Freq: Once | ORAL | Status: AC
  Administered 2013-05-04: 800 mg via ORAL
  Filled 2013-05-04: qty 1

## 2013-05-04 MED ORDER — CYCLOBENZAPRINE HCL 10 MG PO TABS: 10.0000 mg | ORAL_TABLET | Freq: Three times a day (TID) | ORAL | Status: DC | PRN
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
## 2013-05-07 NOTE — ED Provider Notes (Signed)
Medical screening examination/treatment/procedure(s) were performed by non-physician practitioner and as supervising physician I was immediately available for consultation/collaboration.   Toy Baker, MD 05/07/13 [phone]

## 2013-10-12 ENCOUNTER — Encounter (HOSPITAL_COMMUNITY): Payer: Self-pay | Admitting: Emergency Medicine

## 2013-10-12 ENCOUNTER — Emergency Department (HOSPITAL_COMMUNITY): Payer: No Typology Code available for payment source

## 2013-10-12 ENCOUNTER — Emergency Department (HOSPITAL_COMMUNITY)
Admission: EM | Admit: 2013-10-12 | Discharge: 2013-10-12 | Disposition: A | Discharge status: Home / Self Care | Payer: No Typology Code available for payment source | Attending: Emergency Medicine | Admitting: Emergency Medicine

## 2013-10-12 DIAGNOSIS — Z8781 Personal history of (healed) traumatic fracture: Secondary | ICD-10-CM | POA: Insufficient documentation

## 2013-10-12 DIAGNOSIS — Y92410 Unspecified street and highway as the place of occurrence of the external cause: Secondary | ICD-10-CM | POA: Insufficient documentation

## 2013-10-12 DIAGNOSIS — S20219A Contusion of unspecified front wall of thorax, initial encounter: Secondary | ICD-10-CM

## 2013-10-12 DIAGNOSIS — R209 Unspecified disturbances of skin sensation: Secondary | ICD-10-CM | POA: Insufficient documentation

## 2013-10-12 DIAGNOSIS — R202 Paresthesia of skin: Secondary | ICD-10-CM

## 2013-10-12 DIAGNOSIS — I10 Essential (primary) hypertension: Secondary | ICD-10-CM | POA: Insufficient documentation

## 2013-10-12 DIAGNOSIS — S60229A Contusion of unspecified hand, initial encounter: Secondary | ICD-10-CM | POA: Insufficient documentation

## 2013-10-12 DIAGNOSIS — S298XXA Other specified injuries of thorax, initial encounter: Secondary | ICD-10-CM | POA: Insufficient documentation

## 2013-10-12 DIAGNOSIS — S7000XA Contusion of unspecified hip, initial encounter: Secondary | ICD-10-CM | POA: Insufficient documentation

## 2013-10-12 DIAGNOSIS — Y9389 Activity, other specified: Secondary | ICD-10-CM | POA: Insufficient documentation

## 2013-10-12 DIAGNOSIS — S60222A Contusion of left hand, initial encounter: Secondary | ICD-10-CM

## 2013-10-12 DIAGNOSIS — S7002XA Contusion of left hip, initial encounter: Secondary | ICD-10-CM

## 2013-10-12 DIAGNOSIS — Z87891 Personal history of nicotine dependence: Secondary | ICD-10-CM | POA: Insufficient documentation

## 2013-10-12 MED ORDER — HYDROCODONE-ACETAMINOPHEN 5-325 MG PO TABS: 2.0000 | ORAL_TABLET | Freq: Four times a day (QID) | ORAL | Status: DC | PRN

## 2013-10-12 NOTE — ED Notes (Signed)
Dr. Stevie Kern made aware of discharge VS.  No orders.  Advised pt to follow up with PCP re: BP.  Pt stated she has HBP and is hasn't been on meds x 5 years.

## 2013-10-12 NOTE — Discharge Instructions (Signed)
You have been diagnosed by your caregiver as having chest wall pain. SEEK IMMEDIATE MEDICAL ATTENTION IF: You develop a fever.  Your chest pains become severe or intolerable.  You develop new, unexplained symptoms (problems).  You develop shortness of breath, nausea, vomiting, sweating or feel light headed.  You develop a new cough or you cough up blood.  SEEK IMMEDIATE MEDICAL ATTENTION IF: New numbness, tingling, weakness, or problem with the use of your arms or legs.  Severe pain not relieved with medications.  Change in bowel or bladder control.  Increasing pain in any areas of the body (such as chest or abdominal pain).  Shortness of breath, dizziness or fainting.  Nausea (feeling sick to your stomach), vomiting, fever, or sweats.  SEEK IMMEDIATE MEDICAL ATTENTION IF: You develop difficulties swallowing or breathing.  You have new or worse numbness, weakness, tingling, or movement problems in your arms or legs.  You develop increasing pain which is uncontrolled with medications.  You have change in bowel or bladder function, or other concerns.
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
## 2014-05-20 ENCOUNTER — Emergency Department: Payer: Self-pay | Admitting: Emergency Medicine

## 2015-02-17 ENCOUNTER — Encounter: Payer: Self-pay | Admitting: Gastroenterology

## 2015-06-14 DIAGNOSIS — Z87891 Personal history of nicotine dependence: Secondary | ICD-10-CM | POA: Diagnosis not present

## 2015-06-14 DIAGNOSIS — R109 Unspecified abdominal pain: Secondary | ICD-10-CM | POA: Diagnosis not present

## 2015-06-14 DIAGNOSIS — I10 Essential (primary) hypertension: Secondary | ICD-10-CM | POA: Insufficient documentation

## 2015-06-14 LAB — BASIC METABOLIC PANEL
ANION GAP: 7 (ref 5–15)
BUN: 14 mg/dL (ref 6–20)
CALCIUM: 9.1 mg/dL (ref 8.9–10.3)
CO2: 31 mmol/L (ref 22–32)
CREATININE: 0.85 mg/dL (ref 0.44–1.00)
Chloride: 103 mmol/L (ref 101–111)
Glucose, Bld: 105 mg/dL — ABNORMAL HIGH (ref 65–99)
Potassium: 3.4 mmol/L — ABNORMAL LOW (ref 3.5–5.1)
SODIUM: 141 mmol/L (ref 135–145)

## 2015-06-14 LAB — CBC WITH DIFFERENTIAL/PLATELET
Basophils Absolute: 0.1 10*3/uL (ref 0–0.1)
Basophils Relative: 1 %
EOS PCT: 3 %
Eosinophils Absolute: 0.3 10*3/uL (ref 0–0.7)
HCT: 39 % (ref 35.0–47.0)
Hemoglobin: 12.7 g/dL (ref 12.0–16.0)
LYMPHS ABS: 3.2 10*3/uL (ref 1.0–3.6)
LYMPHS PCT: 31 %
MCH: 27.7 pg (ref 26.0–34.0)
MCHC: 32.5 g/dL (ref 32.0–36.0)
MCV: 85.2 fL (ref 80.0–100.0)
MONO ABS: 0.7 10*3/uL (ref 0.2–0.9)
Monocytes Relative: 7 %
Neutro Abs: 5.8 10*3/uL (ref 1.4–6.5)
Neutrophils Relative %: 58 %
PLATELETS: 350 10*3/uL (ref 150–440)
RBC: 4.58 MIL/uL (ref 3.80–5.20)
RDW: 15.1 % — AB (ref 11.5–14.5)
WBC: 10.1 10*3/uL (ref 3.6–11.0)

## 2015-06-14 LAB — URINALYSIS COMPLETE WITH MICROSCOPIC (ARMC ONLY)
BACTERIA UA: NONE SEEN
BILIRUBIN URINE: NEGATIVE
GLUCOSE, UA: NEGATIVE mg/dL
KETONES UR: NEGATIVE mg/dL
LEUKOCYTES UA: NEGATIVE
NITRITE: NEGATIVE
PH: 7 (ref 5.0–8.0)
Protein, ur: NEGATIVE mg/dL
Specific Gravity, Urine: 1.017 (ref 1.005–1.030)
Squamous Epithelial / LPF: NONE SEEN

## 2015-06-14 NOTE — ED Notes (Signed)
Patient to ED for left lower quadrant "pulling" and flank pain. States she got up this morning and did a little cleaning up and then felt the pain start. Denies N/V/D. Tried to work today but had to leave due to not feeling well. Patient denies dysuria or frequency. Denies vaginal bleeding outside of normal spotting.

## 2015-06-15 ENCOUNTER — Emergency Department
Admission: EM | Admit: 2015-06-15 | Discharge: 2015-06-15 | Disposition: A | Discharge status: Home / Self Care | Payer: BLUE CROSS/BLUE SHIELD | Attending: Emergency Medicine | Admitting: Emergency Medicine

## 2015-06-15 ENCOUNTER — Emergency Department: Payer: BLUE CROSS/BLUE SHIELD

## 2015-06-15 DIAGNOSIS — R109 Unspecified abdominal pain: Secondary | ICD-10-CM

## 2015-06-15 HISTORY — DX: Family history of other diseases of the respiratory system: Z83.6

## 2015-06-15 MED ORDER — CYCLOBENZAPRINE HCL 10 MG PO TABS: 10.0000 mg | ORAL_TABLET | Freq: Three times a day (TID) | ORAL | Status: DC | PRN

## 2015-06-15 MED ORDER — SODIUM CHLORIDE 0.9 % IV BOLUS (SEPSIS)
1000.0000 mL | Freq: Once | INTRAVENOUS | Status: AC
  Administered 2015-06-15: 1000 mL via INTRAVENOUS

## 2015-06-15 NOTE — Discharge Instructions (Signed)
Flank Pain °Flank pain refers to pain that is located on the side of the body between the upper abdomen and the back. The pain may occur over a short period of time (acute) or may be long-term or reoccurring (chronic). It may be mild or severe. Flank pain can be caused by many things. °CAUSES  °Some of the more common causes of flank pain include: °· Muscle strains.   °· Muscle spasms.   °· A disease of your spine (vertebral disk disease).   °· A lung infection (pneumonia).   °· Fluid around your lungs (pulmonary edema).   °· A kidney infection.   °· Kidney stones.   °· A very painful skin rash caused by the chickenpox virus (shingles).   °· Gallbladder disease.   °HOME CARE INSTRUCTIONS  °Home care will depend on the cause of your pain. In general, °· Rest as directed by your caregiver. °· Drink enough fluids to keep your urine clear or pale yellow. °· Only take over-the-counter or prescription medicines as directed by your caregiver. Some medicines may help relieve the pain. °· Tell your caregiver about any changes in your pain. °· Follow up with your caregiver as directed. °SEEK IMMEDIATE MEDICAL CARE IF:  °· Your pain is not controlled with medicine.   °· You have new or worsening symptoms. °· Your pain increases.   °· You have abdominal pain.   °· You have shortness of breath.   °· You have persistent nausea or vomiting.   °· You have swelling in your abdomen.   °· You feel faint or pass out.   °· You have blood in your urine. °· You have a fever or persistent symptoms for more than 2-3 days. °· You have a fever and your symptoms suddenly get worse. °MAKE SURE YOU:  °· Understand these instructions. °· Will watch your condition. °· Will get help right away if you are not doing well or get worse. °  °This information is not intended to replace advice given to you by your health care provider. Make sure you discuss any questions you have with your health care provider. °  °Document Released: 08/17/2005 Document  Revised: 03/20/2012 Document Reviewed: 02/08/2012 °Elsevier Interactive Patient Education ©2016 [employer] ° °

## 2015-06-15 NOTE — ED Notes (Signed)
Patient with no complaints at this time. Respirations even and unlabored. Skin warm/dry. Discharge instructions reviewed with patient at this time. Patient given opportunity to voice concerns/ask questions. IV removed per policy and band-aid applied to site. Patient discharged at this time and left Emergency Department with steady gait.  

## 2015-06-15 NOTE — ED Notes (Signed)
MD Brown at bedside.
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
## 2015-08-10 ENCOUNTER — Other Ambulatory Visit: Payer: Self-pay | Admitting: Obstetrics and Gynecology

## 2015-08-10 DIAGNOSIS — E221 Hyperprolactinemia: Secondary | ICD-10-CM

## 2015-08-27 ENCOUNTER — Ambulatory Visit
Admission: RE | Admit: 2015-08-27 | Discharge: 2015-08-27 | Disposition: A | Discharge status: Home / Self Care | Payer: BLUE CROSS/BLUE SHIELD | Source: Ambulatory Visit | Attending: Obstetrics and Gynecology | Admitting: Obstetrics and Gynecology

## 2015-08-27 DIAGNOSIS — D352 Benign neoplasm of pituitary gland: Secondary | ICD-10-CM | POA: Diagnosis not present

## 2015-08-27 DIAGNOSIS — R51 Headache: Secondary | ICD-10-CM | POA: Diagnosis present

## 2015-08-27 DIAGNOSIS — E221 Hyperprolactinemia: Secondary | ICD-10-CM | POA: Insufficient documentation

## 2015-08-27 MED ORDER — GADOBENATE DIMEGLUMINE 529 MG/ML IV SOLN
10.0000 mL | Freq: Once | INTRAVENOUS | Status: AC | PRN
  Administered 2015-08-27: 10 mL via INTRAVENOUS

## 2015-10-11 ENCOUNTER — Other Ambulatory Visit: Payer: BLUE CROSS/BLUE SHIELD

## 2015-10-15 ENCOUNTER — Encounter: Admission: RE | Payer: Self-pay | Source: Ambulatory Visit

## 2015-10-15 ENCOUNTER — Ambulatory Visit
Admission: RE | Admit: 2015-10-15 | Payer: BLUE CROSS/BLUE SHIELD | Source: Ambulatory Visit | Admitting: Obstetrics and Gynecology

## 2015-10-15 SURGERY — DILATATION & CURETTAGE/HYSTEROSCOPY WITH NOVASURE ABLATION
Anesthesia: Choice

## 2015-11-24 ENCOUNTER — Encounter
Admission: RE | Admit: 2015-11-24 | Discharge: 2015-11-24 | Disposition: A | Discharge status: Home / Self Care | Payer: BLUE CROSS/BLUE SHIELD | Source: Ambulatory Visit | Attending: Obstetrics and Gynecology | Admitting: Obstetrics and Gynecology

## 2015-11-24 DIAGNOSIS — Z01812 Encounter for preprocedural laboratory examination: Secondary | ICD-10-CM | POA: Diagnosis not present

## 2015-11-24 DIAGNOSIS — Z01810 Encounter for preprocedural cardiovascular examination: Secondary | ICD-10-CM | POA: Insufficient documentation

## 2015-11-24 DIAGNOSIS — I10 Essential (primary) hypertension: Secondary | ICD-10-CM

## 2015-11-24 HISTORY — DX: Hyperprolactinemia: E22.1

## 2015-11-24 HISTORY — DX: Benign neoplasm of pituitary gland: D35.2

## 2015-11-24 LAB — CBC
HCT: 32.1 % — ABNORMAL LOW (ref 35.0–47.0)
HEMOGLOBIN: 10.3 g/dL — AB (ref 12.0–16.0)
MCH: 26.7 pg (ref 26.0–34.0)
MCHC: 32.2 g/dL (ref 32.0–36.0)
MCV: 83 fL (ref 80.0–100.0)
Platelets: 349 10*3/uL (ref 150–440)
RBC: 3.86 MIL/uL (ref 3.80–5.20)
RDW: 15.2 % — ABNORMAL HIGH (ref 11.5–14.5)
WBC: 9.4 10*3/uL (ref 3.6–11.0)

## 2015-11-24 NOTE — Patient Instructions (Signed)
Your procedure is scheduled on: Tuesday 11/30/15 Report to Day Surgery. 2ND FLOOR MEDICAL MALL ENTRANCE To find out your arrival time please call [phone] between 1PM - 3PM on Monday 11/29/15.  Remember: Instructions that are not followed completely may result in serious medical risk, up to and including death, or upon the discretion of your surgeon and anesthesiologist your surgery may need to be rescheduled.    __X__ 1. Do not eat food or drink liquids after midnight. No gum chewing or hard candies.     __X__ 2. No Alcohol for 24 hours before or after surgery.   ____ 3. Bring all medications with you on the day of surgery if instructed.    __X__ 4. Notify your doctor if there is any change in your medical condition     (cold, fever, infections).     Do not wear jewelry, make-up, hairpins, clips or nail polish.  Do not wear lotions, powders, or perfumes.   Do not shave 48 hours prior to surgery. Men may shave face and neck.  Do not bring valuables to the hospital.    [facility] is not responsible for any belongings or valuables.               Contacts, dentures or bridgework may not be worn into surgery.  Leave your suitcase in the car. After surgery it may be brought to your room.  For patients admitted to the hospital, discharge time is determined by your                treatment team.   Patients discharged the day of surgery will not be allowed to drive home.   Please read over the following fact sheets that you were given:   Surgical Site Infection Prevention   ____ Take these medicines the morning of surgery with A SIP OF WATER:    1. NONE  2.   3.   4.  5.  6.  ____ Fleet Enema (as directed)   ____ Use CHG Soap as directed  ____ Use inhalers on the day of surgery  ____ Stop metformin 2 days prior to surgery    ____ Take 1/2 of usual insulin dose the night before surgery and none on the morning of surgery.   __X__ Stop Coumadin/Plavix/aspirin on TODAY NO  ASPRIN OR GOODY POWDER MAY USE TYLENOL   ____ Stop Anti-inflammatories on    ____ Stop supplements until after surgery.    ____ Bring C-Pap to the hospital.

## 2015-11-30 ENCOUNTER — Ambulatory Visit: Payer: BLUE CROSS/BLUE SHIELD | Admitting: Anesthesiology

## 2015-11-30 ENCOUNTER — Ambulatory Visit
Admission: RE | Admit: 2015-11-30 | Discharge: 2015-11-30 | Disposition: A | Discharge status: Home / Self Care | Payer: BLUE CROSS/BLUE SHIELD | Source: Ambulatory Visit | Attending: Obstetrics and Gynecology | Admitting: Obstetrics and Gynecology

## 2015-11-30 ENCOUNTER — Encounter
Admission: RE | Disposition: A | Discharge status: Home / Self Care | Payer: Self-pay | Source: Ambulatory Visit | Attending: Obstetrics and Gynecology

## 2015-11-30 DIAGNOSIS — D649 Anemia, unspecified: Secondary | ICD-10-CM | POA: Insufficient documentation

## 2015-11-30 DIAGNOSIS — Z803 Family history of malignant neoplasm of breast: Secondary | ICD-10-CM | POA: Insufficient documentation

## 2015-11-30 DIAGNOSIS — N840 Polyp of corpus uteri: Secondary | ICD-10-CM | POA: Diagnosis not present

## 2015-11-30 DIAGNOSIS — F17200 Nicotine dependence, unspecified, uncomplicated: Secondary | ICD-10-CM | POA: Diagnosis not present

## 2015-11-30 DIAGNOSIS — N920 Excessive and frequent menstruation with regular cycle: Secondary | ICD-10-CM | POA: Diagnosis present

## 2015-11-30 DIAGNOSIS — Z8249 Family history of ischemic heart disease and other diseases of the circulatory system: Secondary | ICD-10-CM | POA: Diagnosis not present

## 2015-11-30 DIAGNOSIS — Z833 Family history of diabetes mellitus: Secondary | ICD-10-CM | POA: Diagnosis not present

## 2015-11-30 DIAGNOSIS — Z79899 Other long term (current) drug therapy: Secondary | ICD-10-CM | POA: Insufficient documentation

## 2015-11-30 DIAGNOSIS — I10 Essential (primary) hypertension: Secondary | ICD-10-CM | POA: Diagnosis not present

## 2015-11-30 HISTORY — PX: INTRAUTERINE DEVICE (IUD) INSERTION: SHX5877

## 2015-11-30 HISTORY — PX: HYSTEROSCOPY WITH D & C: SHX1775

## 2015-11-30 LAB — POCT I-STAT 4, (NA,K, GLUC, HGB,HCT)
Glucose, Bld: 91 mg/dL (ref 65–99)
HCT: 31 % — ABNORMAL LOW (ref 36.0–46.0)
HEMOGLOBIN: 10.5 g/dL — AB (ref 12.0–15.0)
POTASSIUM: 3.2 mmol/L — AB (ref 3.5–5.1)
Sodium: 141 mmol/L (ref 135–145)

## 2015-11-30 LAB — POCT PREGNANCY, URINE: Preg Test, Ur: NEGATIVE

## 2015-11-30 SURGERY — DILATATION AND CURETTAGE /HYSTEROSCOPY
Anesthesia: General

## 2015-11-30 MED ORDER — GLYCOPYRROLATE 0.2 MG/ML IJ SOLN
INTRAMUSCULAR | Status: DC | PRN
  Administered 2015-11-30: 0.2 mg via INTRAVENOUS

## 2015-11-30 MED ORDER — LACTATED RINGERS IV SOLN
INTRAVENOUS | Status: DC
  Administered 2015-11-30: 07:00:00 via INTRAVENOUS

## 2015-11-30 MED ORDER — FENTANYL CITRATE (PF) 100 MCG/2ML IJ SOLN
INTRAMUSCULAR | Status: DC | PRN
  Administered 2015-11-30: 50 ug via INTRAVENOUS
  Administered 2015-11-30 (×2): 25 ug via INTRAVENOUS

## 2015-11-30 MED ORDER — FENTANYL CITRATE (PF) 100 MCG/2ML IJ SOLN: 25.0000 ug | INTRAMUSCULAR | Status: DC | PRN

## 2015-11-30 MED ORDER — MIDAZOLAM HCL 2 MG/2ML IJ SOLN
INTRAMUSCULAR | Status: DC | PRN
  Administered 2015-11-30: 2 mg via INTRAVENOUS

## 2015-11-30 MED ORDER — ONDANSETRON HCL 4 MG/2ML IJ SOLN: 4.0000 mg | Freq: Once | INTRAMUSCULAR | Status: DC | PRN

## 2015-11-30 MED ORDER — IBUPROFEN 600 MG PO TABS: 600.0000 mg | ORAL_TABLET | Freq: Four times a day (QID) | ORAL | Status: DC | PRN

## 2015-11-30 MED ORDER — FAMOTIDINE 20 MG PO TABS
20.0000 mg | ORAL_TABLET | Freq: Once | ORAL | Status: AC
  Administered 2015-11-30: 20 mg via ORAL

## 2015-11-30 MED ORDER — LACTATED RINGERS IR SOLN
Status: DC | PRN
  Administered 2015-11-30: 1000 mL

## 2015-11-30 MED ORDER — ONDANSETRON HCL 4 MG/2ML IJ SOLN
INTRAMUSCULAR | Status: DC | PRN
  Administered 2015-11-30: 4 mg via INTRAVENOUS

## 2015-11-30 MED ORDER — HYDROCODONE-ACETAMINOPHEN 5-325 MG PO TABS: 1.0000 | ORAL_TABLET | Freq: Four times a day (QID) | ORAL | Status: DC | PRN

## 2015-11-30 MED ORDER — DEXAMETHASONE SODIUM PHOSPHATE 10 MG/ML IJ SOLN
INTRAMUSCULAR | Status: DC | PRN
  Administered 2015-11-30: 10 mg via INTRAVENOUS

## 2015-11-30 MED ORDER — LIDOCAINE HCL (CARDIAC) 20 MG/ML IV SOLN
INTRAVENOUS | Status: DC | PRN
  Administered 2015-11-30: 60 mg via INTRAVENOUS

## 2015-11-30 MED ORDER — PROPOFOL 10 MG/ML IV BOLUS
INTRAVENOUS | Status: DC | PRN
  Administered 2015-11-30: 200 mg via INTRAVENOUS

## 2015-11-30 MED ORDER — FAMOTIDINE 20 MG PO TABS
ORAL_TABLET | ORAL | Status: AC
  Administered 2015-11-30: 20 mg via ORAL
  Filled 2015-11-30: qty 1

## 2015-11-30 SURGICAL SUPPLY — 16 items
CATH ROBINSON RED A/P 16FR (CATHETERS) ×2 IMPLANT
ELECT REM PT RETURN 9FT ADLT (ELECTROSURGICAL) ×2
ELECTRODE REM PT RTRN 9FT ADLT (ELECTROSURGICAL) ×1 IMPLANT
GLOVE BIO SURGEON STRL SZ7 (GLOVE) ×3 IMPLANT
GLOVE INDICATOR 7.5 STRL GRN (GLOVE) ×3 IMPLANT
GOWN STRL REUS W/ TWL LRG LVL3 (GOWN DISPOSABLE) ×2 IMPLANT
GOWN STRL REUS W/TWL LRG LVL3 (GOWN DISPOSABLE) ×4
IV LACTATED RINGERS 1000ML (IV SOLUTION) ×2 IMPLANT
KIT RM TURNOVER CYSTO AR (KITS) ×2 IMPLANT
MYOSURE LITE POLYP REMOVAL (MISCELLANEOUS) ×1 IMPLANT
PACK DNC HYST (MISCELLANEOUS) ×2 IMPLANT
PAD OB MATERNITY 4.3X12.25 (PERSONAL CARE ITEMS) ×2 IMPLANT
PAD PREP 24X41 OB/GYN DISP (PERSONAL CARE ITEMS) ×2 IMPLANT
SEAL ROD LENS SCOPE MYOSURE (ABLATOR) ×1 IMPLANT
TOWEL OR 17X26 4PK STRL BLUE (TOWEL DISPOSABLE) ×2 IMPLANT
TUBING CONNECTING 10 (TUBING) ×2 IMPLANT

## 2015-11-30 NOTE — Anesthesia Postprocedure Evaluation (Signed)
Anesthesia Post Note  Patient: Precious Haws  Procedure(s) Performed: Procedure(s) (LRB): DILATATION AND CURETTAGE /HYSTEROSCOPY (N/A) INTRAUTERINE DEVICE (IUD) INSERTION (N/A)  Patient location during evaluation: PACU Anesthesia Type: General Level of consciousness: awake and alert Pain management: pain level controlled Vital Signs Assessment: post-procedure vital signs reviewed and stable Respiratory status: spontaneous breathing, nonlabored ventilation, respiratory function stable and patient connected to nasal cannula oxygen Cardiovascular status: blood pressure returned to baseline and stable Postop Assessment: no signs of nausea or vomiting Anesthetic complications: no    Last Vitals:  Filed Vitals:   11/30/15 1026 11/30/15 1145  BP: 118/72 110/68  Pulse: 66 72  Temp:  36.1 C  Resp: 16 16    Last Pain:  Filed Vitals:   11/30/15 1154  PainSc: 0-No pain                 Marian Grandt S

## 2015-11-30 NOTE — Op Note (Signed)
Patient Name: Linda Morales Date of Procedure: @TODAY @  Preoperative Diagnosis: 1) 45 y.o. with menorrhagia 2) Endometrial polyp  Postoperative Diagnosis: 1) 45 y.o. with menorrhagia 2) Endometrial polyp  Operation Performed: Hysteroscopy, dilation and curettage, Mirena IUD insertion  Indication: Menorrhagia, work up showing prolactinoma and endometrial polyp  Anesthesia: General  Primary Surgeon: Malachy Mood, MD  Assistant: none  Preoperative Antibiotics: none  Estimated Blood Loss: 37mL  IV Fluids: 58mL  Urine Output:: 74mL straight cath  Drains or Tubes: none  Implants: none  Specimens Removed: endometrial curettings, endometrial polyp  Complications: none  Intraoperative Findings:  Two small endometrial polyps lower uterine segment posterior wall  Patient Condition: stable  Procedure in Detail:  Patient was taken to the operating room were she was administered general endotracheal anesthesia.  She was positioned in the dorsal lithotomy position utilizing Allen stirups, prepped and draped in the usual sterile fashion.  Uterus was noted to be non-enlarged in size, anteverted.   Prior to proceeding with the case a time out was performed.  Attention was turned to the patient's pelvis.  A red rubber catheter was used to empty the patient's bladder.  An operative speculum was placed to allow visualization of the cervix.  The anterior lip of the cervix was grasped with a single tooth tenaculum and the cervix was sequentially dilated using pratt dilators, sounded to 8cm.  The hysteroscope was then advanced into the uterine cavity noting the above findings, polyps were removed using a myosure blade.  Sharp curettage was performed and the resulting specimen collected and sent to pathology.    The Mirena IUD flange was set at 7cm, IUD inserted was advanced to the uterine fundus and the IUD was deployed, string cut at 3cm.  The single tooth tenaculum was removed from the  cervix.  The tenaculum sites and cervix were noted to be  Hemostatic before removing the operative speculum.  Sponge needle and instrument counts were corrects times two.  The patient tolerated the procedure well and was taken to the recovery room in stable condition.

## 2015-11-30 NOTE — Discharge Instructions (Signed)
AMBULATORY SURGERY  DISCHARGE INSTRUCTIONS   1) The drugs that you were given will stay in your system until tomorrow so for the next 24 hours you should not:  A) Drive an automobile B) Make any legal decisions C) Drink any alcoholic beverage   2) You may resume regular meals tomorrow.  Today it is better to start with liquids and gradually work up to solid foods.  You may eat anything you prefer, but it is better to start with liquids, then soup and crackers, and gradually work up to solid foods.   3) Please notify your doctor immediately if you have any unusual bleeding, trouble breathing, redness and pain at the surgery site, drainage, fever, or pain not relieved by medication.    4) Additional Instructions:Drink plenty of fluids to flush anesthesia out of system. Keep bladder empty to ease any cramping.      Please contact your physician with any problems or Same Day Surgery at [phone], Monday through Friday 6 am to 4 pm, or [place] at [facility] number at [phone].AMBULATORY SURGERY  DISCHARGE INSTRUCTIONS   5) The drugs that you were given will stay in your system until tomorrow so for the next 24 hours you should not:  D) Drive an automobile E) Make any legal decisions F) Drink any alcoholic beverage   6) You may resume regular meals tomorrow.  Today it is better to start with liquids and gradually work up to solid foods.  You may eat anything you prefer, but it is better to start with liquids, then soup and crackers, and gradually work up to solid foods.   7) Please notify your doctor immediately if you have any unusual bleeding, trouble breathing, redness and pain at the surgery site, drainage, fever, or pain not relieved by medication.    8) Additional Instructions:        Please contact your physician with any problems or Same Day Surgery at [phone], Monday through Friday 6 am to 4 pm, or [place] at [facility] number at  [phone].
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
## 2015-11-30 NOTE — OR Nursing (Signed)
Dr Georgianne Fick notified of vaginal piercing that patient nor myself are able to remove.  Dr Georgianne Fick states " the piercing does not need to be removed for surgery"

## 2015-11-30 NOTE — H&P (Signed)
Date of Initial paper H&P: 11/24/15  History reviewed, patient examined, no change in status, stable for surgery.

## 2015-11-30 NOTE — OR Nursing (Signed)
ISTAT potassium result of 3.2 called  To Dr Marcello Moores, no new orders at this time.

## 2015-11-30 NOTE — OR Nursing (Signed)
mirena IUD:  [id number]  EXP-01/20

## 2015-11-30 NOTE — Anesthesia Procedure Notes (Signed)
Procedure Name: LMA Insertion Performed by: Ronnika Collett Pre-anesthesia Checklist: Patient identified, Patient being monitored, Timeout performed, Emergency Drugs available and Suction available Patient Re-evaluated:Patient Re-evaluated prior to inductionOxygen Delivery Method: Circle system utilized Preoxygenation: Pre-oxygenation with 100% oxygen Intubation Type: IV induction Ventilation: Mask ventilation without difficulty LMA: LMA inserted LMA Size: 4.0 Tube type: Oral Number of attempts: 1 Placement Confirmation: positive ETCO2 and breath sounds checked- equal and bilateral Tube secured with: Tape Dental Injury: Teeth and Oropharynx as per pre-operative assessment        

## 2015-11-30 NOTE — Transfer of Care (Signed)
Immediate Anesthesia Transfer of Care Note  Patient: Linda Morales  Procedure(s) Performed: Procedure(s): DILATATION AND CURETTAGE /HYSTEROSCOPY (N/A) INTRAUTERINE DEVICE (IUD) INSERTION (N/A)  Patient Location: PACU  Anesthesia Type:General  Level of Consciousness: awake  Airway & Oxygen Therapy: Patient Spontanous Breathing and Patient connected to face mask oxygen  Post-op Assessment: Report given to RN  Post vital signs: Reviewed  Last Vitals:  Filed Vitals:   11/30/15 0608 11/30/15 0928  BP: 139/99 152/72  Pulse:  91  Temp:    Resp:  15    Last Pain: There were no vitals filed for this visit.       Complications: No apparent anesthesia complications

## 2015-12-27 ENCOUNTER — Emergency Department: Payer: BLUE CROSS/BLUE SHIELD

## 2015-12-27 ENCOUNTER — Emergency Department
Admission: EM | Admit: 2015-12-27 | Discharge: 2015-12-27 | Disposition: A | Discharge status: Home / Self Care | Payer: BLUE CROSS/BLUE SHIELD | Attending: Emergency Medicine | Admitting: Emergency Medicine

## 2015-12-27 DIAGNOSIS — Y999 Unspecified external cause status: Secondary | ICD-10-CM | POA: Insufficient documentation

## 2015-12-27 DIAGNOSIS — Y92410 Unspecified street and highway as the place of occurrence of the external cause: Secondary | ICD-10-CM | POA: Insufficient documentation

## 2015-12-27 DIAGNOSIS — I10 Essential (primary) hypertension: Secondary | ICD-10-CM | POA: Insufficient documentation

## 2015-12-27 DIAGNOSIS — S161XXA Strain of muscle, fascia and tendon at neck level, initial encounter: Secondary | ICD-10-CM | POA: Insufficient documentation

## 2015-12-27 DIAGNOSIS — M25511 Pain in right shoulder: Secondary | ICD-10-CM | POA: Diagnosis not present

## 2015-12-27 DIAGNOSIS — Z7982 Long term (current) use of aspirin: Secondary | ICD-10-CM | POA: Insufficient documentation

## 2015-12-27 DIAGNOSIS — Z79899 Other long term (current) drug therapy: Secondary | ICD-10-CM | POA: Insufficient documentation

## 2015-12-27 DIAGNOSIS — S199XXA Unspecified injury of neck, initial encounter: Secondary | ICD-10-CM | POA: Diagnosis present

## 2015-12-27 DIAGNOSIS — Y939 Activity, unspecified: Secondary | ICD-10-CM | POA: Diagnosis not present

## 2015-12-27 DIAGNOSIS — F17200 Nicotine dependence, unspecified, uncomplicated: Secondary | ICD-10-CM | POA: Insufficient documentation

## 2015-12-27 MED ORDER — CYCLOBENZAPRINE HCL 5 MG PO TABS: 5.0000 mg | ORAL_TABLET | Freq: Three times a day (TID) | ORAL | Status: DC | PRN

## 2015-12-27 MED ORDER — IBUPROFEN 800 MG PO TABS
800.0000 mg | ORAL_TABLET | Freq: Once | ORAL | Status: AC
  Administered 2015-12-27: 800 mg via ORAL
  Filled 2015-12-27: qty 1

## 2015-12-27 MED ORDER — IBUPROFEN 800 MG PO TABS: 800.0000 mg | ORAL_TABLET | Freq: Three times a day (TID) | ORAL | Status: DC | PRN
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
## 2016-01-02 ENCOUNTER — Emergency Department
Admission: EM | Admit: 2016-01-02 | Discharge: 2016-01-02 | Disposition: A | Discharge status: Home / Self Care | Payer: BLUE CROSS/BLUE SHIELD | Attending: Emergency Medicine | Admitting: Emergency Medicine

## 2016-01-02 ENCOUNTER — Encounter: Payer: Self-pay | Admitting: Emergency Medicine

## 2016-01-02 DIAGNOSIS — Y92410 Unspecified street and highway as the place of occurrence of the external cause: Secondary | ICD-10-CM | POA: Insufficient documentation

## 2016-01-02 DIAGNOSIS — I10 Essential (primary) hypertension: Secondary | ICD-10-CM | POA: Insufficient documentation

## 2016-01-02 DIAGNOSIS — M62838 Other muscle spasm: Secondary | ICD-10-CM | POA: Diagnosis not present

## 2016-01-02 DIAGNOSIS — R51 Headache: Secondary | ICD-10-CM | POA: Diagnosis not present

## 2016-01-02 DIAGNOSIS — Z79899 Other long term (current) drug therapy: Secondary | ICD-10-CM | POA: Insufficient documentation

## 2016-01-02 DIAGNOSIS — R519 Headache, unspecified: Secondary | ICD-10-CM

## 2016-01-02 DIAGNOSIS — Z7982 Long term (current) use of aspirin: Secondary | ICD-10-CM | POA: Diagnosis not present

## 2016-01-02 DIAGNOSIS — Y999 Unspecified external cause status: Secondary | ICD-10-CM | POA: Diagnosis not present

## 2016-01-02 DIAGNOSIS — Y9389 Activity, other specified: Secondary | ICD-10-CM | POA: Diagnosis not present

## 2016-01-02 DIAGNOSIS — F17200 Nicotine dependence, unspecified, uncomplicated: Secondary | ICD-10-CM | POA: Diagnosis not present

## 2016-01-02 DIAGNOSIS — M542 Cervicalgia: Secondary | ICD-10-CM | POA: Diagnosis present

## 2016-01-02 MED ORDER — PROCHLORPERAZINE EDISYLATE 5 MG/ML IJ SOLN
10.0000 mg | Freq: Once | INTRAMUSCULAR | Status: AC
Start: 2016-01-02 — End: 2016-01-02
  Administered 2016-01-02: 10 mg via INTRAMUSCULAR
  Filled 2016-01-02: qty 2

## 2016-01-02 MED ORDER — KETOROLAC TROMETHAMINE 30 MG/ML IJ SOLN
30.0000 mg | Freq: Once | INTRAMUSCULAR | Status: AC
  Administered 2016-01-02: 30 mg via INTRAMUSCULAR
  Filled 2016-01-02: qty 1

## 2016-01-02 MED ORDER — ETODOLAC 500 MG PO TABS: 500.0000 mg | ORAL_TABLET | Freq: Two times a day (BID) | ORAL | Status: DC

## 2016-01-02 MED ORDER — METHOCARBAMOL 500 MG PO TABS: 500.0000 mg | ORAL_TABLET | Freq: Four times a day (QID) | ORAL | Status: DC

## 2016-01-02 MED ORDER — DIAZEPAM 5 MG/ML IJ SOLN
5.0000 mg | Freq: Once | INTRAMUSCULAR | Status: AC
  Administered 2016-01-02: 5 mg via INTRAMUSCULAR
  Filled 2016-01-02: qty 2
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
## 2016-01-02 NOTE — ED Notes (Signed)
Reviewed d/c instructions, follow-up care, and prescriptions with pt. Pt verbalized understanding. Pt's daughter is driving pt home. Gave pt work note
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
## 2016-01-02 NOTE — ED Notes (Signed)
Patient reports headache with pain to muscles at sides of neck.
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
## 2016-01-31 LAB — SURGICAL PATHOLOGY

## 2016-05-28 ENCOUNTER — Emergency Department
Admission: EM | Admit: 2016-05-28 | Discharge: 2016-05-28 | Disposition: A | Discharge status: Home / Self Care | Payer: No Typology Code available for payment source | Attending: Emergency Medicine | Admitting: Emergency Medicine

## 2016-05-28 ENCOUNTER — Encounter: Payer: Self-pay | Admitting: Emergency Medicine

## 2016-05-28 DIAGNOSIS — Y999 Unspecified external cause status: Secondary | ICD-10-CM | POA: Diagnosis not present

## 2016-05-28 DIAGNOSIS — Y92410 Unspecified street and highway as the place of occurrence of the external cause: Secondary | ICD-10-CM | POA: Insufficient documentation

## 2016-05-28 DIAGNOSIS — F17200 Nicotine dependence, unspecified, uncomplicated: Secondary | ICD-10-CM | POA: Insufficient documentation

## 2016-05-28 DIAGNOSIS — S199XXA Unspecified injury of neck, initial encounter: Secondary | ICD-10-CM | POA: Diagnosis present

## 2016-05-28 DIAGNOSIS — M7918 Myalgia, other site: Secondary | ICD-10-CM

## 2016-05-28 DIAGNOSIS — Z7982 Long term (current) use of aspirin: Secondary | ICD-10-CM | POA: Insufficient documentation

## 2016-05-28 DIAGNOSIS — S29011A Strain of muscle and tendon of front wall of thorax, initial encounter: Secondary | ICD-10-CM | POA: Insufficient documentation

## 2016-05-28 DIAGNOSIS — Z79899 Other long term (current) drug therapy: Secondary | ICD-10-CM | POA: Insufficient documentation

## 2016-05-28 DIAGNOSIS — I10 Essential (primary) hypertension: Secondary | ICD-10-CM | POA: Diagnosis not present

## 2016-05-28 DIAGNOSIS — S161XXA Strain of muscle, fascia and tendon at neck level, initial encounter: Secondary | ICD-10-CM | POA: Insufficient documentation

## 2016-05-28 DIAGNOSIS — Y9389 Activity, other specified: Secondary | ICD-10-CM | POA: Diagnosis not present

## 2016-05-28 MED ORDER — BACLOFEN 10 MG PO TABS: 10.0000 mg | ORAL_TABLET | Freq: Three times a day (TID) | ORAL | 0 refills | Status: DC

## 2016-05-28 MED ORDER — NAPROXEN 500 MG PO TABS: 500.0000 mg | ORAL_TABLET | Freq: Two times a day (BID) | ORAL | 0 refills | Status: DC
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
## 2017-03-27 ENCOUNTER — Encounter: Payer: Self-pay | Admitting: Emergency Medicine

## 2017-03-27 ENCOUNTER — Emergency Department
Admission: EM | Admit: 2017-03-27 | Discharge: 2017-03-27 | Disposition: A | Discharge status: Home / Self Care | Payer: Self-pay | Attending: Emergency Medicine | Admitting: Emergency Medicine

## 2017-03-27 ENCOUNTER — Emergency Department: Payer: Self-pay

## 2017-03-27 DIAGNOSIS — S93402A Sprain of unspecified ligament of left ankle, initial encounter: Secondary | ICD-10-CM | POA: Insufficient documentation

## 2017-03-27 DIAGNOSIS — Z7982 Long term (current) use of aspirin: Secondary | ICD-10-CM | POA: Insufficient documentation

## 2017-03-27 DIAGNOSIS — F17210 Nicotine dependence, cigarettes, uncomplicated: Secondary | ICD-10-CM | POA: Insufficient documentation

## 2017-03-27 DIAGNOSIS — Y939 Activity, unspecified: Secondary | ICD-10-CM | POA: Insufficient documentation

## 2017-03-27 DIAGNOSIS — Z79899 Other long term (current) drug therapy: Secondary | ICD-10-CM | POA: Insufficient documentation

## 2017-03-27 DIAGNOSIS — Y998 Other external cause status: Secondary | ICD-10-CM | POA: Insufficient documentation

## 2017-03-27 DIAGNOSIS — X58XXXA Exposure to other specified factors, initial encounter: Secondary | ICD-10-CM | POA: Insufficient documentation

## 2017-03-27 DIAGNOSIS — I10 Essential (primary) hypertension: Secondary | ICD-10-CM | POA: Insufficient documentation

## 2017-03-27 DIAGNOSIS — Y929 Unspecified place or not applicable: Secondary | ICD-10-CM | POA: Insufficient documentation

## 2017-03-27 MED ORDER — TRAMADOL HCL 50 MG PO TABS
50.0000 mg | ORAL_TABLET | Freq: Once | ORAL | Status: AC
Start: 2017-03-27 — End: 2017-03-27
  Administered 2017-03-27: 50 mg via ORAL
  Filled 2017-03-27: qty 1

## 2017-03-27 MED ORDER — NAPROXEN 500 MG PO TABS
500.0000 mg | ORAL_TABLET | Freq: Once | ORAL | Status: AC
  Administered 2017-03-27: 500 mg via ORAL
  Filled 2017-03-27: qty 1

## 2017-03-27 MED ORDER — TRAMADOL HCL 50 MG PO TABS: 50.0000 mg | ORAL_TABLET | Freq: Four times a day (QID) | ORAL | 0 refills | Status: DC | PRN

## 2017-03-27 MED ORDER — NAPROXEN 500 MG PO TABS: 500.0000 mg | ORAL_TABLET | Freq: Two times a day (BID) | ORAL | Status: DC
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
## 2017-03-27 NOTE — ED Triage Notes (Signed)
Presents with left ankle pain and swelling   Unsure of injury sxs' started yesterday  Good pulse and sensation
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
## 2017-10-03 ENCOUNTER — Emergency Department: Payer: Self-pay

## 2017-10-03 ENCOUNTER — Emergency Department
Admission: EM | Admit: 2017-10-03 | Discharge: 2017-10-03 | Disposition: A | Discharge status: Home / Self Care | Payer: Self-pay | Attending: Emergency Medicine | Admitting: Emergency Medicine

## 2017-10-03 ENCOUNTER — Other Ambulatory Visit: Payer: Self-pay

## 2017-10-03 ENCOUNTER — Encounter: Payer: Self-pay | Admitting: Emergency Medicine

## 2017-10-03 DIAGNOSIS — Y929 Unspecified place or not applicable: Secondary | ICD-10-CM | POA: Insufficient documentation

## 2017-10-03 DIAGNOSIS — I10 Essential (primary) hypertension: Secondary | ICD-10-CM | POA: Insufficient documentation

## 2017-10-03 DIAGNOSIS — Y9389 Activity, other specified: Secondary | ICD-10-CM | POA: Insufficient documentation

## 2017-10-03 DIAGNOSIS — Z7982 Long term (current) use of aspirin: Secondary | ICD-10-CM | POA: Insufficient documentation

## 2017-10-03 DIAGNOSIS — Z79899 Other long term (current) drug therapy: Secondary | ICD-10-CM | POA: Insufficient documentation

## 2017-10-03 DIAGNOSIS — Y999 Unspecified external cause status: Secondary | ICD-10-CM | POA: Insufficient documentation

## 2017-10-03 DIAGNOSIS — F17200 Nicotine dependence, unspecified, uncomplicated: Secondary | ICD-10-CM | POA: Insufficient documentation

## 2017-10-03 DIAGNOSIS — S92515A Nondisplaced fracture of proximal phalanx of left lesser toe(s), initial encounter for closed fracture: Secondary | ICD-10-CM | POA: Insufficient documentation

## 2017-10-03 DIAGNOSIS — W2209XA Striking against other stationary object, initial encounter: Secondary | ICD-10-CM | POA: Insufficient documentation

## 2017-10-03 MED ORDER — MELOXICAM 15 MG PO TABS: 15.0000 mg | ORAL_TABLET | Freq: Every day | ORAL | 0 refills | Status: DC

## 2017-10-03 NOTE — ED Triage Notes (Signed)
Patient ambulatory to triage with steady gait, without difficulty or distress noted; pt reports wk ago injured her left 3rd toe trying to catch her grandbaby from falling
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
## 2017-12-27 ENCOUNTER — Ambulatory Visit: Payer: Medicaid Other | Admitting: Nurse Practitioner

## 2017-12-29 ENCOUNTER — Emergency Department
Admission: EM | Admit: 2017-12-29 | Discharge: 2017-12-29 | Disposition: A | Discharge status: Home / Self Care | Payer: Self-pay | Attending: Student in an Organized Health Care Education/Training Program | Admitting: Student in an Organized Health Care Education/Training Program

## 2017-12-29 ENCOUNTER — Other Ambulatory Visit: Payer: Self-pay

## 2017-12-29 ENCOUNTER — Encounter: Payer: Self-pay | Admitting: Emergency Medicine

## 2017-12-29 DIAGNOSIS — J0100 Acute maxillary sinusitis, unspecified: Secondary | ICD-10-CM | POA: Insufficient documentation

## 2017-12-29 DIAGNOSIS — Z79899 Other long term (current) drug therapy: Secondary | ICD-10-CM | POA: Insufficient documentation

## 2017-12-29 DIAGNOSIS — F17210 Nicotine dependence, cigarettes, uncomplicated: Secondary | ICD-10-CM | POA: Insufficient documentation

## 2017-12-29 DIAGNOSIS — I10 Essential (primary) hypertension: Secondary | ICD-10-CM | POA: Insufficient documentation

## 2017-12-29 MED ORDER — FLUTICASONE PROPIONATE 50 MCG/ACT NA SUSP: 2.0000 | Freq: Every day | NASAL | 2 refills | Status: DC

## 2017-12-29 MED ORDER — AMLODIPINE BESYLATE 5 MG PO TABS
5.0000 mg | ORAL_TABLET | Freq: Every day | ORAL | 0 refills | Status: DC
Start: 2017-12-29 — End: 2018-01-29

## 2017-12-29 MED ORDER — HYDROCHLOROTHIAZIDE 25 MG PO TABS: 25.0000 mg | ORAL_TABLET | Freq: Every day | ORAL | 3 refills | Status: DC

## 2017-12-29 MED ORDER — CABERGOLINE 0.5 MG PO TABS: 0.2500 mg | ORAL_TABLET | ORAL | 3 refills | Status: DC

## 2017-12-29 NOTE — Discharge Instructions (Addendum)
Follow-up with your regular doctor if not better in 3 to 5 days.  [facility].  Medication as prescribed.  Have your blood pressure rechecked at a drugstore or fire station to make sure it is coming down with the medication.
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
## 2017-12-29 NOTE — ED Triage Notes (Signed)
Pt presents to ED c/o sinus pressure and nasal congestion x4 days. No OTC meds.

## 2018-01-29 ENCOUNTER — Other Ambulatory Visit: Payer: Self-pay

## 2018-01-29 ENCOUNTER — Ambulatory Visit
Admission: EM | Admit: 2018-01-29 | Discharge: 2018-01-29 | Disposition: A | Discharge status: Home / Self Care | Payer: Medicaid Other | Attending: Family Medicine | Admitting: Family Medicine

## 2018-01-29 ENCOUNTER — Encounter: Payer: Self-pay | Admitting: Emergency Medicine

## 2018-01-29 DIAGNOSIS — Z79899 Other long term (current) drug therapy: Secondary | ICD-10-CM | POA: Insufficient documentation

## 2018-01-29 DIAGNOSIS — Z8249 Family history of ischemic heart disease and other diseases of the circulatory system: Secondary | ICD-10-CM | POA: Insufficient documentation

## 2018-01-29 DIAGNOSIS — M5442 Lumbago with sciatica, left side: Secondary | ICD-10-CM

## 2018-01-29 DIAGNOSIS — F17210 Nicotine dependence, cigarettes, uncomplicated: Secondary | ICD-10-CM | POA: Insufficient documentation

## 2018-01-29 DIAGNOSIS — M5441 Lumbago with sciatica, right side: Secondary | ICD-10-CM | POA: Insufficient documentation

## 2018-01-29 LAB — URINALYSIS, COMPLETE (UACMP) WITH MICROSCOPIC
Bilirubin Urine: NEGATIVE
GLUCOSE, UA: NEGATIVE mg/dL
Ketones, ur: NEGATIVE mg/dL
Nitrite: NEGATIVE
Protein, ur: NEGATIVE mg/dL
SPECIFIC GRAVITY, URINE: 1.02 (ref 1.005–1.030)
pH: 7 (ref 5.0–8.0)

## 2018-01-29 MED ORDER — TIZANIDINE HCL 4 MG PO TABS: 4.0000 mg | ORAL_TABLET | Freq: Four times a day (QID) | ORAL | 0 refills | Status: DC | PRN

## 2018-01-29 MED ORDER — MELOXICAM 15 MG PO TABS: 15.0000 mg | ORAL_TABLET | Freq: Every day | ORAL | 0 refills | Status: DC | PRN

## 2018-01-29 NOTE — Discharge Instructions (Signed)
Medication for your pain.  We will call if the culture results are positive.  Take care  Dr. Lacinda Axon

## 2018-01-29 NOTE — ED Triage Notes (Signed)
Patient c/o generalized abdominal pain that started on Sunday. Denies nausea, vomiting and diarrhea. Denies urinary symptoms. Denies fever.
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
## 2018-01-31 LAB — URINE CULTURE

## 2018-02-04 ENCOUNTER — Encounter (HOSPITAL_COMMUNITY): Payer: Self-pay | Admitting: *Deleted

## 2018-02-04 ENCOUNTER — Emergency Department (HOSPITAL_COMMUNITY): Payer: Medicaid Other

## 2018-02-04 ENCOUNTER — Other Ambulatory Visit: Payer: Self-pay

## 2018-02-04 ENCOUNTER — Emergency Department (HOSPITAL_COMMUNITY)
Admission: EM | Admit: 2018-02-04 | Discharge: 2018-02-04 | Disposition: A | Discharge status: Home / Self Care | Payer: Medicaid Other | Attending: Emergency Medicine | Admitting: Emergency Medicine

## 2018-02-04 DIAGNOSIS — R1030 Lower abdominal pain, unspecified: Secondary | ICD-10-CM | POA: Diagnosis present

## 2018-02-04 LAB — URINALYSIS, ROUTINE W REFLEX MICROSCOPIC
Bilirubin Urine: NEGATIVE
Glucose, UA: NEGATIVE mg/dL
Ketones, ur: NEGATIVE mg/dL
Nitrite: NEGATIVE
Protein, ur: NEGATIVE mg/dL
Specific Gravity, Urine: 1.021 (ref 1.005–1.030)
pH: 6 (ref 5.0–8.0)

## 2018-02-04 LAB — COMPREHENSIVE METABOLIC PANEL
ALT: 13 U/L (ref 0–44)
AST: 15 U/L (ref 15–41)
Albumin: 3.6 g/dL (ref 3.5–5.0)
Alkaline Phosphatase: 56 U/L (ref 38–126)
Anion gap: 8 (ref 5–15)
BUN: 10 mg/dL (ref 6–20)
CO2: 31 mmol/L (ref 22–32)
Calcium: 8.6 mg/dL — ABNORMAL LOW (ref 8.9–10.3)
Chloride: 101 mmol/L (ref 98–111)
Creatinine, Ser: 1.2 mg/dL — ABNORMAL HIGH (ref 0.44–1.00)
GFR calc Af Amer: >60 mL/min (ref >60)
GFR calc non Af Amer: 53 mL/min — ABNORMAL LOW (ref >60)
Glucose, Bld: 139 mg/dL — ABNORMAL HIGH (ref 70–99)
Potassium: 2.7 mmol/L — CL (ref 3.5–5.1)
Sodium: 140 mmol/L (ref 135–145)
Total Bilirubin: 0.6 mg/dL (ref 0.3–1.2)
Total Protein: 7.2 g/dL (ref 6.5–8.1)

## 2018-02-04 LAB — CBC
HCT: 38.4 % (ref 36.0–46.0)
Hemoglobin: 12.2 g/dL (ref 12.0–15.0)
MCH: 29.2 pg (ref 26.0–34.0)
MCHC: 31.8 g/dL (ref 30.0–36.0)
MCV: 91.9 fL (ref 78.0–100.0)
Platelets: 332 10*3/uL (ref 150–400)
RBC: 4.18 MIL/uL (ref 3.87–5.11)
RDW: 12.7 % (ref 11.5–15.5)
WBC: 9.3 10*3/uL (ref 4.0–10.5)

## 2018-02-04 LAB — LIPASE, BLOOD: Lipase: 44 U/L (ref 11–51)

## 2018-02-04 MED ORDER — CEFTRIAXONE SODIUM 1 G IJ SOLR
1.0000 g | Freq: Once | INTRAMUSCULAR | Status: AC
  Administered 2018-02-04: 1 g via INTRAVENOUS
  Filled 2018-02-04: qty 10

## 2018-02-04 MED ORDER — MAGNESIUM SULFATE 2 GM/50ML IV SOLN
2.0000 g | Freq: Once | INTRAVENOUS | Status: AC
  Administered 2018-02-04: 2 g via INTRAVENOUS
  Filled 2018-02-04: qty 50

## 2018-02-04 MED ORDER — POTASSIUM CHLORIDE CRYS ER 20 MEQ PO TBCR
60.0000 meq | EXTENDED_RELEASE_TABLET | Freq: Once | ORAL | Status: AC
  Administered 2018-02-04: 60 meq via ORAL
  Filled 2018-02-04: qty 3

## 2018-02-04 MED ORDER — LACTATED RINGERS IV BOLUS
1000.0000 mL | Freq: Once | INTRAVENOUS | Status: AC
  Administered 2018-02-04: 1000 mL via INTRAVENOUS

## 2018-02-04 MED ORDER — CEPHALEXIN 500 MG PO CAPS: 1000.0000 mg | ORAL_CAPSULE | Freq: Two times a day (BID) | ORAL | 0 refills | Status: DC

## 2018-02-04 MED ORDER — IOHEXOL 300 MG/ML  SOLN
100.0000 mL | Freq: Once | INTRAMUSCULAR | Status: AC | PRN
Start: 2018-02-04 — End: 2018-02-04
  Administered 2018-02-04: 100 mL via INTRAVENOUS

## 2018-02-04 MED ORDER — POTASSIUM CHLORIDE 10 MEQ/100ML IV SOLN
10.0000 meq | INTRAVENOUS | Status: AC
  Administered 2018-02-04 (×2): 10 meq via INTRAVENOUS
  Filled 2018-02-04 (×2): qty 100

## 2018-02-04 MED ORDER — HYDROMORPHONE HCL 1 MG/ML IJ SOLN
0.5000 mg | Freq: Once | INTRAMUSCULAR | Status: AC
  Administered 2018-02-04: 0.5 mg via INTRAVENOUS
  Filled 2018-02-04: qty 1

## 2018-02-04 NOTE — ED Provider Notes (Signed)
Patient placed in Quick Look pathway, seen and evaluated   Chief Complaint: Abdominal pain radiating to the back  HPI:   1 week of worsening abdominal pain that radiates towards the back, no associated fevers, chills, nausea or vomiting.  ROS: + Abdominal pain, back pain. -Fevers, chills, nausea, vomiting  Physical Exam:   Gen: No distress  Neuro: Awake and Alert  Skin: Warm    Focused Exam: Abdomen with generalized tenderness to palpation with guarding in the left upper quadrant   Initiation of care has begun. The patient has been counseled on the process, plan, and necessity for staying for the completion/evaluation, and the remainder of the medical screening examination    Janet Berlin 02/04/18 1351    Duffy Bruce, MD 02/04/18 [phone]
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
## 2018-02-04 NOTE — ED Notes (Signed)
Patient Alert and oriented to baseline. Stable and ambulatory to baseline. Patient verbalized understanding of the discharge instructions.  Patient belongings were taken by the patient.   

## 2018-02-04 NOTE — ED Notes (Signed)
Charge, [place], RN notified of K+ level

## 2018-02-04 NOTE — ED Triage Notes (Addendum)
Pt in c/o generalized abdominal pain that radiates into her back, pt states her abdomen feels full and is distended, denies n/v, LBM yesterday, pain is worse if she lays on her left side, no distress noted. Pain is improved with having a bowel movement.

## 2018-02-05 LAB — URINE CULTURE: Culture: NO GROWTH

## 2018-08-20 ENCOUNTER — Other Ambulatory Visit: Payer: Self-pay

## 2018-08-20 ENCOUNTER — Ambulatory Visit
Admission: EM | Admit: 2018-08-20 | Discharge: 2018-08-20 | Disposition: A | Discharge status: Home / Self Care | Payer: Medicaid Other | Attending: Family Medicine | Admitting: Family Medicine

## 2018-08-20 ENCOUNTER — Encounter: Payer: Self-pay | Admitting: Emergency Medicine

## 2018-08-20 DIAGNOSIS — R05 Cough: Secondary | ICD-10-CM

## 2018-08-20 DIAGNOSIS — R0981 Nasal congestion: Secondary | ICD-10-CM

## 2018-08-20 DIAGNOSIS — J111 Influenza due to unidentified influenza virus with other respiratory manifestations: Secondary | ICD-10-CM

## 2018-08-20 DIAGNOSIS — R509 Fever, unspecified: Secondary | ICD-10-CM

## 2018-08-20 DIAGNOSIS — R69 Illness, unspecified: Principal | ICD-10-CM

## 2018-08-20 DIAGNOSIS — Z720 Tobacco use: Secondary | ICD-10-CM

## 2018-08-20 MED ORDER — ACETAMINOPHEN 500 MG PO TABS
500.0000 mg | ORAL_TABLET | Freq: Once | ORAL | Status: AC
  Administered 2018-08-20: 500 mg via ORAL

## 2018-08-20 MED ORDER — HYDROCOD POLST-CPM POLST ER 10-8 MG/5ML PO SUER: 5.0000 mL | Freq: Every evening | ORAL | 0 refills | Status: DC | PRN

## 2018-08-20 MED ORDER — BENZONATATE 100 MG PO CAPS: 100.0000 mg | ORAL_CAPSULE | Freq: Three times a day (TID) | ORAL | 0 refills | Status: DC | PRN

## 2018-08-20 MED ORDER — OSELTAMIVIR PHOSPHATE 75 MG PO CAPS: 75.0000 mg | ORAL_CAPSULE | Freq: Two times a day (BID) | ORAL | 0 refills | Status: DC

## 2018-08-20 NOTE — Discharge Instructions (Signed)
Take medication as prescribed. Rest. Drink plenty of fluids. Tylenol and ibuprofen as discussed.   Follow up with your primary care physician this week as needed. Return to Urgent care for new or worsening concerns.

## 2018-08-20 NOTE — ED Triage Notes (Signed)
Patient states she has had body aches, chills and nausea for the past 2 days.
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
## 2018-12-20 ENCOUNTER — Other Ambulatory Visit: Payer: Self-pay

## 2018-12-20 ENCOUNTER — Encounter: Payer: Self-pay | Admitting: Emergency Medicine

## 2018-12-20 ENCOUNTER — Ambulatory Visit
Admission: EM | Admit: 2018-12-20 | Discharge: 2018-12-20 | Disposition: A | Discharge status: Home / Self Care | Payer: 59 | Attending: Family Medicine | Admitting: Family Medicine

## 2018-12-20 DIAGNOSIS — S29012A Strain of muscle and tendon of back wall of thorax, initial encounter: Secondary | ICD-10-CM | POA: Diagnosis present

## 2018-12-20 DIAGNOSIS — M940 Chondrocostal junction syndrome [Tietze]: Secondary | ICD-10-CM | POA: Insufficient documentation

## 2018-12-20 DIAGNOSIS — I10 Essential (primary) hypertension: Secondary | ICD-10-CM | POA: Diagnosis present

## 2018-12-20 MED ORDER — CYCLOBENZAPRINE HCL 10 MG PO TABS: 10.0000 mg | ORAL_TABLET | Freq: Three times a day (TID) | ORAL | 0 refills | Status: DC | PRN

## 2018-12-20 MED ORDER — HYDROCHLOROTHIAZIDE 25 MG PO TABS: 25.0000 mg | ORAL_TABLET | Freq: Every day | ORAL | 0 refills | Status: DC

## 2018-12-20 NOTE — ED Triage Notes (Signed)
Patient c/o chest pain, left side neck pain, left side arm pain that started 2 days ago. Patient denies any injury.
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
## 2019-01-06 ENCOUNTER — Emergency Department
Admission: EM | Admit: 2019-01-06 | Discharge: 2019-01-07 | Disposition: A | Discharge status: Home / Self Care | Payer: 59 | Attending: Emergency Medicine | Admitting: Emergency Medicine

## 2019-01-06 ENCOUNTER — Other Ambulatory Visit: Payer: Self-pay

## 2019-01-06 DIAGNOSIS — M545 Low back pain, unspecified: Secondary | ICD-10-CM

## 2019-01-06 DIAGNOSIS — F17200 Nicotine dependence, unspecified, uncomplicated: Secondary | ICD-10-CM | POA: Diagnosis not present

## 2019-01-06 DIAGNOSIS — Z79899 Other long term (current) drug therapy: Secondary | ICD-10-CM | POA: Insufficient documentation

## 2019-01-06 DIAGNOSIS — I10 Essential (primary) hypertension: Secondary | ICD-10-CM | POA: Diagnosis not present

## 2019-01-06 LAB — URINALYSIS, COMPLETE (UACMP) WITH MICROSCOPIC
Bacteria, UA: NONE SEEN
Bilirubin Urine: NEGATIVE
Glucose, UA: NEGATIVE mg/dL
Ketones, ur: NEGATIVE mg/dL
Leukocytes,Ua: NEGATIVE
Nitrite: NEGATIVE
Protein, ur: NEGATIVE mg/dL
Specific Gravity, Urine: 1.015 (ref 1.005–1.030)
pH: 7 (ref 5.0–8.0)

## 2019-01-06 LAB — POCT PREGNANCY, URINE: Preg Test, Ur: NEGATIVE

## 2019-01-06 MED ORDER — CYCLOBENZAPRINE HCL 5 MG PO TABS: 5.0000 mg | ORAL_TABLET | Freq: Three times a day (TID) | ORAL | 0 refills | Status: DC | PRN

## 2019-01-06 MED ORDER — PREDNISONE 10 MG PO TABS: ORAL_TABLET | ORAL | 0 refills | Status: DC

## 2019-01-06 MED ORDER — LIDOCAINE 5 % EX PTCH: 1.0000 | MEDICATED_PATCH | Freq: Two times a day (BID) | CUTANEOUS | 0 refills | Status: DC

## 2019-01-06 MED ORDER — OMEPRAZOLE MAGNESIUM 20 MG PO TBEC: 20.0000 mg | DELAYED_RELEASE_TABLET | Freq: Every day | ORAL | 1 refills | Status: DC

## 2019-01-06 MED ORDER — PREDNISONE 20 MG PO TABS
60.0000 mg | ORAL_TABLET | ORAL | Status: AC
  Administered 2019-01-06: 60 mg via ORAL
  Filled 2019-01-06: qty 3

## 2019-01-06 MED ORDER — IBUPROFEN 600 MG PO TABS: ORAL_TABLET | ORAL | 0 refills | Status: DC

## 2019-01-06 MED ORDER — LIDOCAINE 5 % EX PTCH
1.0000 | MEDICATED_PATCH | CUTANEOUS | Status: DC
  Administered 2019-01-06: 1 via TRANSDERMAL
  Filled 2019-01-06: qty 1
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
## 2019-01-06 NOTE — Discharge Instructions (Signed)
You have been seen in the Emergency Department (ED)  today for back pain.  Your workup and exam have not shown any acute abnormalities and you are likely suffering from muscle strain or possible problems with your discs, but there is no treatment that will fix your symptoms at this time.  Please take Motrin (ibuprofen) as needed for your pain according to the instructions written on the box.  Alternatively, for the next five days you can take 600mg  three times daily with meals (it may upset your stomach).  We also recommend you use an over-the-counter antacid medication like Prilosec or Nexium - this will help protect your stomach while you are taking the prednisone and the ibuprofen.   Please follow up with your doctor as soon as possible regarding today's ED visit and your back pain.  Return to the ED for worsening back pain, fever, weakness or numbness of either leg, or if you develop either (1) an inability to urinate or have bowel movements, or (2) loss of your ability to control your bathroom functions (if you start having "accidents"), or if you develop other new symptoms that concern you.

## 2019-01-06 NOTE — ED Triage Notes (Addendum)
Pt arrives to ED via POV from home with c/o bilateral lower back pain x1.5 weeks. Pt reports being seen at [facility] for same and told she had "tightened muscles" d/t having a very physically demanding job with frequent repetitive motions. Pt reports (+) urinary frequency, but denies pain or burning with urination.

## 2019-01-06 NOTE — ED Notes (Signed)
EDP to bedside. 

## 2019-01-13 ENCOUNTER — Encounter: Payer: Self-pay | Admitting: Adult Health

## 2019-01-13 ENCOUNTER — Ambulatory Visit (INDEPENDENT_AMBULATORY_CARE_PROVIDER_SITE_OTHER): Payer: 59 | Admitting: Adult Health

## 2019-01-13 ENCOUNTER — Other Ambulatory Visit: Payer: Self-pay

## 2019-01-13 VITALS — BP 144/85 | HR 69 | Resp 16 | Ht 68.0 in | Wt 182.0 lb

## 2019-01-13 DIAGNOSIS — K219 Gastro-esophageal reflux disease without esophagitis: Secondary | ICD-10-CM | POA: Diagnosis not present

## 2019-01-13 DIAGNOSIS — Z975 Presence of (intrauterine) contraceptive device: Secondary | ICD-10-CM

## 2019-01-13 DIAGNOSIS — I10 Essential (primary) hypertension: Secondary | ICD-10-CM
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
## 2019-01-15 ENCOUNTER — Telehealth: Payer: Self-pay | Admitting: Obstetrics & Gynecology

## 2019-01-15 NOTE — Telephone Encounter (Signed)
Patient scheduled 7/13 with [facility]

## 2019-01-15 NOTE — Telephone Encounter (Signed)
Patient scheduled 7/13 with [facility] for Mirena replacement.

## 2019-01-15 NOTE — Telephone Encounter (Signed)
[facility] referring for Needs PAP and IUD removal and reinsertion. Called and left voicemail for patient to call back to be schedule

## 2019-01-16 NOTE — Telephone Encounter (Signed)
Mirena reserved for this patient.

## 2019-01-20 ENCOUNTER — Other Ambulatory Visit: Payer: Self-pay

## 2019-01-20 ENCOUNTER — Ambulatory Visit (INDEPENDENT_AMBULATORY_CARE_PROVIDER_SITE_OTHER): Payer: 59 | Admitting: Obstetrics & Gynecology

## 2019-01-20 ENCOUNTER — Other Ambulatory Visit (HOSPITAL_COMMUNITY)
Admission: RE | Admit: 2019-01-20 | Discharge: 2019-01-20 | Disposition: A | Discharge status: Home / Self Care | Payer: 59 | Source: Ambulatory Visit | Attending: Obstetrics & Gynecology | Admitting: Obstetrics & Gynecology

## 2019-01-20 ENCOUNTER — Encounter: Payer: Self-pay | Admitting: Obstetrics & Gynecology

## 2019-01-20 VITALS — BP 130/90 | Ht 67.0 in | Wt 176.0 lb

## 2019-01-20 DIAGNOSIS — Z30433 Encounter for removal and reinsertion of intrauterine contraceptive device: Secondary | ICD-10-CM

## 2019-01-20 DIAGNOSIS — Z124 Encounter for screening for malignant neoplasm of cervix: Secondary | ICD-10-CM

## 2019-01-20 DIAGNOSIS — Z01419 Encounter for gynecological examination (general) (routine) without abnormal findings: Secondary | ICD-10-CM

## 2019-01-20 DIAGNOSIS — Z1239 Encounter for other screening for malignant neoplasm of breast: Secondary | ICD-10-CM
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
## 2019-01-20 NOTE — Patient Instructions (Signed)
PAP every three years Mammogram every year    Call [phone] to schedule at [facility]    Intrauterine Device Insertion, Care After  This sheet gives you information about how to care for yourself after your procedure. Your health care provider may also give you more specific instructions. If you have problems or questions, contact your health care provider. What can I expect after the procedure? After the procedure, it is common to have:  Cramps and pain in the abdomen.  Light bleeding (spotting) or heavier bleeding that is like your menstrual period. This may last for up to a few days.  Lower back pain.  Dizziness.  Headaches.  Nausea. Follow these instructions at home:  Before resuming sexual activity, check to make sure that you can feel the IUD string(s). You should be able to feel the end of the string(s) below the opening of your cervix. If your IUD string is in place, you may resume sexual activity. ? If you had a hormonal IUD inserted more than 7 days after your most recent period started, you will need to use a backup method of birth control for 7 days after IUD insertion. Ask your health care provider whether this applies to you.  Continue to check that the IUD is still in place by feeling for the string(s) after every menstrual period, or once a month.  Take over-the-counter and prescription medicines only as told by your health care provider.  Do not drive or use heavy machinery while taking prescription pain medicine.  Keep all follow-up visits as told by your health care provider. This is important. Contact a health care provider if:  You have bleeding that is heavier or lasts longer than a normal menstrual cycle.  You have a fever.  You have cramps or abdominal pain that get worse or do not get better with medicine.  You develop abdominal pain that is new or is not in the same area of earlier cramping and pain.  You feel lightheaded or weak.  You have  abnormal or bad-smelling discharge from your vagina.  You have pain during sexual activity.  You have any of the following problems with your IUD string(s): ? The string bothers or hurts you or your sexual partner. ? You cannot feel the string. ? The string has gotten longer.  You can feel the IUD in your vagina.  You think you may be pregnant, or you miss your menstrual period.  You think you may have an STI (sexually transmitted infection). Get help right away if:  You have flu-like symptoms.  You have a fever and chills.  You can feel that your IUD has slipped out of place. Summary  After the procedure, it is common to have cramps and pain in the abdomen. It is also common to have light bleeding (spotting) or heavier bleeding that is like your menstrual period.  Continue to check that the IUD is still in place by feeling for the string(s) after every menstrual period, or once a month.  Keep all follow-up visits as told by your health care provider. This is important.  Contact your health care provider if you have problems with your IUD string(s), such as the string getting longer or bothering you or your sexual partner. This information is not intended to replace advice given to you by your health care provider. Make sure you discuss any questions you have with your health care provider. Document Released: 02/22/2011 Document Revised: 06/08/2017 Document Reviewed: 05/17/2016 Elsevier Patient  Education  2020 [employer]  

## 2019-01-21 ENCOUNTER — Other Ambulatory Visit: Payer: Self-pay

## 2019-01-21 ENCOUNTER — Ambulatory Visit
Admission: EM | Admit: 2019-01-21 | Discharge: 2019-01-21 | Disposition: A | Discharge status: Home / Self Care | Payer: 59 | Attending: Urgent Care | Admitting: Urgent Care

## 2019-01-21 DIAGNOSIS — M545 Low back pain, unspecified: Secondary | ICD-10-CM

## 2019-01-21 DIAGNOSIS — R82998 Other abnormal findings in urine: Secondary | ICD-10-CM | POA: Diagnosis not present

## 2019-01-21 DIAGNOSIS — R102 Pelvic and perineal pain: Secondary | ICD-10-CM

## 2019-01-21 DIAGNOSIS — R3129 Other microscopic hematuria: Secondary | ICD-10-CM | POA: Diagnosis not present

## 2019-01-21 LAB — URINALYSIS, COMPLETE (UACMP) WITH MICROSCOPIC
Bilirubin Urine: NEGATIVE
Glucose, UA: NEGATIVE mg/dL
Ketones, ur: NEGATIVE mg/dL
Leukocytes,Ua: NEGATIVE
Nitrite: NEGATIVE
Protein, ur: NEGATIVE mg/dL
Specific Gravity, Urine: 1.025 (ref 1.005–1.030)
WBC, UA: NONE SEEN WBC/hpf (ref 0–5)
pH: 6 (ref 5.0–8.0)
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
## 2019-01-21 NOTE — Discharge Instructions (Addendum)
It was very nice seeing you today in clinic. Thank you for entrusting me with your care.   May use Tylenol and/or Ibuprofen as needed for pain. Call PCP to discuss having your urine rechecked later this week if still having problems.   Call Dr. [facility]on [facility] tomorrow to discuss pelvic pressure after Mirena placement.   If your symptoms/condition worsens, please seek follow up care either here or in the ER. Please remember, our [facility] providers are "right here with you" when you need [place].   Again, it was my pleasure to take care of you today. Thank you for choosing our clinic. I hope that you start to feel better quickly.   Honor Loh, MSN, APRN, FNP-C, CEN Advanced Practice Provider [facility]

## 2019-01-21 NOTE — ED Triage Notes (Addendum)
Patient complains of vaginal pain/ pressure that started after having mirena placed yesterday. Patient reports that she is concerned she has a UTI. Patient states that she thinks maybe her "uterus" dropped.

## 2019-01-22 LAB — CYTOLOGY - PAP
Diagnosis: NEGATIVE
HPV: NOT DETECTED

## 2019-01-23 ENCOUNTER — Other Ambulatory Visit: Payer: Self-pay | Admitting: Adult Health

## 2019-01-24 LAB — COMPREHENSIVE METABOLIC PANEL
ALT: 11 IU/L (ref 0–32)
AST: 15 IU/L (ref 0–40)
Albumin/Globulin Ratio: 1.4 (ref 1.2–2.2)
Albumin: 4 g/dL (ref 3.8–4.8)
Alkaline Phosphatase: 57 IU/L (ref 39–117)
BUN/Creatinine Ratio: 8 — ABNORMAL LOW (ref 9–23)
BUN: 8 mg/dL (ref 6–24)
Bilirubin Total: 0.4 mg/dL (ref 0.0–1.2)
CO2: 27 mmol/L (ref 20–29)
Calcium: 9 mg/dL (ref 8.7–10.2)
Chloride: 100 mmol/L (ref 96–106)
Creatinine, Ser: 1.02 mg/dL — ABNORMAL HIGH (ref 0.57–1.00)
GFR calc Af Amer: 75 mL/min/{1.73_m2} (ref >59)
GFR calc non Af Amer: 65 mL/min/{1.73_m2} (ref >59)
Globulin, Total: 2.9 g/dL (ref 1.5–4.5)
Glucose: 88 mg/dL (ref 65–99)
Potassium: 3.5 mmol/L (ref 3.5–5.2)
Sodium: 142 mmol/L (ref 134–144)
Total Protein: 6.9 g/dL (ref 6.0–8.5)

## 2019-01-24 LAB — LIPID PANEL WITH LDL/HDL RATIO
Cholesterol, Total: 192 mg/dL (ref 100–199)
HDL: 44 mg/dL (ref >39)
LDL Calculated: 128 mg/dL — ABNORMAL HIGH (ref 0–99)
LDl/HDL Ratio: 2.9 ratio (ref 0.0–3.2)
Triglycerides: 99 mg/dL (ref 0–149)
VLDL Cholesterol Cal: 20 mg/dL (ref 5–40)

## 2019-01-24 LAB — CBC WITH DIFFERENTIAL/PLATELET
Basophils Absolute: 0.1 10*3/uL (ref 0.0–0.2)
Basos: 1 %
EOS (ABSOLUTE): 0.2 10*3/uL (ref 0.0–0.4)
Eos: 2 %
Hematocrit: 39.1 % (ref 34.0–46.6)
Hemoglobin: 13.1 g/dL (ref 11.1–15.9)
Immature Grans (Abs): 0 10*3/uL (ref 0.0–0.1)
Immature Granulocytes: 0 %
Lymphocytes Absolute: 3.5 10*3/uL — ABNORMAL HIGH (ref 0.7–3.1)
Lymphs: 39 %
MCH: 30 pg (ref 26.6–33.0)
MCHC: 33.5 g/dL (ref 31.5–35.7)
MCV: 90 fL (ref 79–97)
Monocytes Absolute: 0.6 10*3/uL (ref 0.1–0.9)
Monocytes: 7 %
Neutrophils Absolute: 4.6 10*3/uL (ref 1.4–7.0)
Neutrophils: 51 %
Platelets: 379 10*3/uL (ref 150–450)
RBC: 4.37 x10E6/uL (ref 3.77–5.28)
RDW: 12.5 % (ref 11.7–15.4)
WBC: 9 10*3/uL (ref 3.4–10.8)

## 2019-01-24 LAB — TSH: TSH: 1.22 u[IU]/mL (ref 0.450–4.500)

## 2019-01-24 LAB — T4, FREE: Free T4: 1.14 ng/dL (ref 0.82–1.77)

## 2019-02-01 ENCOUNTER — Encounter: Payer: Self-pay | Admitting: Obstetrics and Gynecology

## 2019-02-17 ENCOUNTER — Ambulatory Visit: Payer: 59 | Admitting: Obstetrics & Gynecology

## 2019-02-26 ENCOUNTER — Ambulatory Visit (INDEPENDENT_AMBULATORY_CARE_PROVIDER_SITE_OTHER): Payer: 59 | Admitting: Obstetrics & Gynecology

## 2019-02-26 ENCOUNTER — Encounter: Payer: Self-pay | Admitting: Obstetrics & Gynecology

## 2019-02-26 ENCOUNTER — Other Ambulatory Visit: Payer: Self-pay

## 2019-02-26 VITALS — BP 130/90 | Ht 69.0 in | Wt 177.0 lb

## 2019-02-26 DIAGNOSIS — Z30431 Encounter for routine checking of intrauterine contraceptive device: Secondary | ICD-10-CM | POA: Diagnosis not present

## 2019-02-26 DIAGNOSIS — R350 Frequency of micturition: Secondary | ICD-10-CM

## 2019-02-26 DIAGNOSIS — Z803 Family history of malignant neoplasm of breast: Secondary | ICD-10-CM
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
## 2019-03-01 LAB — URINE CULTURE

## 2019-03-03 ENCOUNTER — Other Ambulatory Visit: Payer: Self-pay | Admitting: Obstetrics & Gynecology

## 2019-03-03 DIAGNOSIS — R350 Frequency of micturition: Secondary | ICD-10-CM

## 2019-03-03 MED ORDER — SULFAMETHOXAZOLE-TRIMETHOPRIM 800-160 MG PO TABS: 1.0000 | ORAL_TABLET | Freq: Two times a day (BID) | ORAL | 0 refills | Status: AC

## 2019-03-03 NOTE — Progress Notes (Signed)
Let her know [facility] eRx for UTI at [facility]

## 2019-03-03 NOTE — Progress Notes (Signed)
Left message to make pt aware of rx.

## 2019-03-11 DIAGNOSIS — Z1371 Encounter for nonprocreative screening for genetic disease carrier status: Secondary | ICD-10-CM

## 2019-03-11 HISTORY — DX: Encounter for nonprocreative screening for genetic disease carrier status: Z13.71

## 2019-03-31 ENCOUNTER — Encounter: Payer: Self-pay | Admitting: Obstetrics and Gynecology

## 2019-04-02 ENCOUNTER — Other Ambulatory Visit: Payer: Self-pay

## 2019-04-02 ENCOUNTER — Encounter: Payer: Self-pay | Admitting: Adult Health

## 2019-04-02 ENCOUNTER — Ambulatory Visit (INDEPENDENT_AMBULATORY_CARE_PROVIDER_SITE_OTHER): Payer: 59 | Admitting: Adult Health

## 2019-04-02 VITALS — BP 147/97 | HR 68 | Resp 16 | Ht 68.0 in | Wt 177.0 lb

## 2019-04-02 DIAGNOSIS — I10 Essential (primary) hypertension: Secondary | ICD-10-CM

## 2019-04-02 DIAGNOSIS — D497 Neoplasm of unspecified behavior of endocrine glands and other parts of nervous system: Secondary | ICD-10-CM

## 2019-04-02 DIAGNOSIS — K219 Gastro-esophageal reflux disease without esophagitis: Secondary | ICD-10-CM

## 2019-04-02 MED ORDER — HYDROCHLOROTHIAZIDE 25 MG PO TABS: 25.0000 mg | ORAL_TABLET | Freq: Every day | ORAL | 6 refills | Status: AC

## 2019-04-02 MED ORDER — CABERGOLINE 0.5 MG PO TABS: 0.2500 mg | ORAL_TABLET | ORAL | 0 refills | Status: DC
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
## 2019-04-03 ENCOUNTER — Other Ambulatory Visit: Payer: Self-pay | Admitting: Obstetrics & Gynecology

## 2019-04-03 MED ORDER — CABERGOLINE 0.5 MG PO TABS: 0.2500 mg | ORAL_TABLET | ORAL | 0 refills | Status: DC

## 2019-04-03 NOTE — Addendum Note (Signed)
Addended by: Kendell Bane on: 04/03/2019 12:27 PM   Modules accepted: Orders

## 2019-04-09 ENCOUNTER — Other Ambulatory Visit: Payer: Self-pay | Admitting: Obstetrics & Gynecology

## 2019-04-09 NOTE — Progress Notes (Signed)
Follow up discussion regarding recent genetic testing due to familial high risk factors.  Her lab genetic panel was negative.  Reassurance provided and counseling as to the pros and cons of testing was done today.  Since the current test is not perfect, it is possible that there may be a gene mutation that current testing cannot detect, but that chance is small. It is possible that a different genetic factor, which has not yet been discovered or is not on this panel, is responsible for the cancer diagnoses in the family. Again, the likelihood of this is low.   Most cancers happen by chance and this test, along with details of her family history, suggests that her cancer falls into this category. She is recommended to follow the cancer screening guidelines provided by her physician.  Breast cancer risk is further calculated based on the T-C model.  Even with negative gene testing, she still has a risk of 16% based on this calculation.  Recommendations are for yearly breast exams, mammograms, and even MRI when this risk is > 20%.    Barnett Applebaum, MD, [facility], [facility] 04/09/2019  1:45 PM

## 2019-04-13 ENCOUNTER — Emergency Department: Payer: 59

## 2019-04-13 ENCOUNTER — Emergency Department
Admission: EM | Admit: 2019-04-13 | Discharge: 2019-04-13 | Disposition: A | Discharge status: Home / Self Care | Payer: 59 | Attending: Student | Admitting: Student

## 2019-04-13 ENCOUNTER — Encounter: Payer: Self-pay | Admitting: Emergency Medicine

## 2019-04-13 ENCOUNTER — Other Ambulatory Visit: Payer: Self-pay

## 2019-04-13 DIAGNOSIS — M546 Pain in thoracic spine: Secondary | ICD-10-CM | POA: Diagnosis not present

## 2019-04-13 DIAGNOSIS — R519 Headache, unspecified: Secondary | ICD-10-CM | POA: Diagnosis not present

## 2019-04-13 DIAGNOSIS — M545 Low back pain, unspecified: Secondary | ICD-10-CM

## 2019-04-13 DIAGNOSIS — R109 Unspecified abdominal pain: Secondary | ICD-10-CM | POA: Insufficient documentation

## 2019-04-13 DIAGNOSIS — M7918 Myalgia, other site: Secondary | ICD-10-CM | POA: Diagnosis present

## 2019-04-13 DIAGNOSIS — I10 Essential (primary) hypertension: Secondary | ICD-10-CM | POA: Insufficient documentation

## 2019-04-13 DIAGNOSIS — Z79899 Other long term (current) drug therapy: Secondary | ICD-10-CM | POA: Insufficient documentation

## 2019-04-13 DIAGNOSIS — F17210 Nicotine dependence, cigarettes, uncomplicated: Secondary | ICD-10-CM | POA: Diagnosis not present

## 2019-04-13 LAB — CBC
HCT: 37.5 % (ref 36.0–46.0)
Hemoglobin: 12.3 g/dL (ref 12.0–15.0)
MCH: 29.6 pg (ref 26.0–34.0)
MCHC: 32.8 g/dL (ref 30.0–36.0)
MCV: 90.1 fL (ref 80.0–100.0)
Platelets: 314 10*3/uL (ref 150–400)
RBC: 4.16 MIL/uL (ref 3.87–5.11)
RDW: 12.4 % (ref 11.5–15.5)
WBC: 7.8 10*3/uL (ref 4.0–10.5)
nRBC: 0 % (ref 0.0–0.2)

## 2019-04-13 LAB — DIFFERENTIAL
Abs Immature Granulocytes: 0.02 10*3/uL (ref 0.00–0.07)
Basophils Absolute: 0.1 10*3/uL (ref 0.0–0.1)
Basophils Relative: 1 %
Eosinophils Absolute: 0.2 10*3/uL (ref 0.0–0.5)
Eosinophils Relative: 2 %
Immature Granulocytes: 0 %
Lymphocytes Relative: 49 %
Lymphs Abs: 3.8 10*3/uL (ref 0.7–4.0)
Monocytes Absolute: 0.5 10*3/uL (ref 0.1–1.0)
Monocytes Relative: 6 %
Neutro Abs: 3.3 10*3/uL (ref 1.7–7.7)
Neutrophils Relative %: 42 %

## 2019-04-13 LAB — BASIC METABOLIC PANEL
Anion gap: 7 (ref 5–15)
BUN: 13 mg/dL (ref 6–20)
CO2: 28 mmol/L (ref 22–32)
Calcium: 8.4 mg/dL — ABNORMAL LOW (ref 8.9–10.3)
Chloride: 104 mmol/L (ref 98–111)
Creatinine, Ser: 0.97 mg/dL (ref 0.44–1.00)
GFR calc Af Amer: >60 mL/min (ref >60)
GFR calc non Af Amer: >60 mL/min (ref >60)
Glucose, Bld: 84 mg/dL (ref 70–99)
Potassium: 3.5 mmol/L (ref 3.5–5.1)
Sodium: 139 mmol/L (ref 135–145)

## 2019-04-13 MED ORDER — OXYCODONE-ACETAMINOPHEN 5-325 MG PO TABS
1.0000 | ORAL_TABLET | Freq: Once | ORAL | Status: AC
  Administered 2019-04-13: 14:00:00 1 via ORAL
  Filled 2019-04-13: qty 1

## 2019-04-13 MED ORDER — IOHEXOL 300 MG/ML  SOLN
100.0000 mL | Freq: Once | INTRAMUSCULAR | Status: AC | PRN
  Administered 2019-04-13: 16:00:00 100 mL via INTRAVENOUS

## 2019-04-13 NOTE — ED Notes (Signed)
Pt given food/beverage  
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
## 2019-04-13 NOTE — Discharge Instructions (Addendum)
Here is the information from your CT scan that we discussed, which you should follow up with your primary care doctor:  Three small pulmonary nodules are present. The largest is a subsolid 5 mm nodule in the right upper lobe. No follow-up needed ifpatient is low-risk. Non-contrast chest CT can be considered in 12 months if patient is high-risk.   It is normal to feel stiff and sore for the next few days, please take ibuprofen and Tylenol as written on the box help with her symptoms.  Please follow-up with your primary care doctor to review your ER visit and follow-up on your symptoms.  Please return to the emergency department for any new or worsening symptoms.
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
## 2019-04-14 LAB — POCT I-STAT CREATININE: Creatinine, Ser: 1.1 mg/dL — ABNORMAL HIGH (ref 0.44–1.00)

## 2019-04-17 ENCOUNTER — Telehealth: Payer: Self-pay

## 2019-04-17 ENCOUNTER — Other Ambulatory Visit: Payer: Self-pay | Admitting: Obstetrics & Gynecology

## 2019-04-17 NOTE — Telephone Encounter (Signed)
Called pt--no voicemail set up

## 2019-04-17 NOTE — Telephone Encounter (Signed)
Patient aware to schedule MMG

## 2019-04-17 NOTE — Telephone Encounter (Signed)
-----   Message from Gae Dry, MD sent at 04/17/2019  9:54 AM EDT ----- Regarding: MMG Received notice she has not received MMG yet as ordered at her Annual. Please check and encourage her to do this, and document conversation.

## 2019-04-17 NOTE — Telephone Encounter (Signed)
Pt called back and Clarise Cruz advised Pt to schedule mammogram

## 2019-04-22 ENCOUNTER — Encounter: Payer: Self-pay | Admitting: Gastroenterology

## 2019-06-27 ENCOUNTER — Telehealth: Payer: Self-pay

## 2019-06-27 NOTE — Telephone Encounter (Signed)
Patient rescheduled appointment on 07/01/2019 to 07/17/2019. klh

## 2019-07-01 ENCOUNTER — Ambulatory Visit: Payer: 59 | Admitting: Adult Health

## 2019-07-15 ENCOUNTER — Telehealth: Payer: Self-pay

## 2019-07-15 NOTE — Telephone Encounter (Signed)
Confirmed appointment with patient. klh °

## 2019-07-17 ENCOUNTER — Ambulatory Visit: Payer: 59 | Admitting: Adult Health

## 2019-07-18 ENCOUNTER — Telehealth: Payer: Self-pay

## 2019-07-18 NOTE — Telephone Encounter (Signed)
Billed patient missed appointment fee 07/17/2019

## 2019-09-11 ENCOUNTER — Other Ambulatory Visit: Payer: Self-pay | Admitting: Adult Health

## 2019-09-11 DIAGNOSIS — D497 Neoplasm of unspecified behavior of endocrine glands and other parts of nervous system: Secondary | ICD-10-CM

## 2019-09-12 ENCOUNTER — Telehealth: Payer: Self-pay

## 2019-09-12 NOTE — Telephone Encounter (Signed)
Called pt to let pt know that her medication was refilled per Dr. Clayborn Bigness. Notified pt we gave a few refills in the meantime that she is unable to come into the office due to financial reason.

## 2019-09-12 NOTE — Telephone Encounter (Signed)
Please look at patient chart, she is unable to schedule appointment due to not working and no insurance and no copay, I gave her options of other clinics but she will need this medication, she is not able to bring any money towards self pay copay, please look and advise since this is important medications. Beth

## 2019-09-12 NOTE — Telephone Encounter (Signed)
Hey this patient needs an appt.

## 2019-09-29 ENCOUNTER — Telehealth: Payer: Self-pay

## 2019-09-29 NOTE — Telephone Encounter (Signed)
REQUESTED RECORD REQUEST COMPLETED AND MAILED RECORDS TO [facility].

## 2019-11-14 ENCOUNTER — Other Ambulatory Visit: Payer: Self-pay

## 2019-11-14 ENCOUNTER — Encounter: Payer: Self-pay | Admitting: Emergency Medicine

## 2019-11-14 ENCOUNTER — Ambulatory Visit
Admission: EM | Admit: 2019-11-14 | Discharge: 2019-11-14 | Disposition: A | Discharge status: Home / Self Care | Payer: Medicaid Other | Attending: Nurse Practitioner | Admitting: Nurse Practitioner

## 2019-11-14 DIAGNOSIS — K0889 Other specified disorders of teeth and supporting structures: Secondary | ICD-10-CM

## 2019-11-14 DIAGNOSIS — K029 Dental caries, unspecified: Secondary | ICD-10-CM

## 2019-11-14 MED ORDER — HYDROCODONE-ACETAMINOPHEN 5-325 MG PO TABS: 1.0000 | ORAL_TABLET | ORAL | 0 refills | Status: AC | PRN

## 2019-11-14 MED ORDER — AMOXICILLIN-POT CLAVULANATE 875-125 MG PO TABS: 1.0000 | ORAL_TABLET | Freq: Two times a day (BID) | ORAL | 0 refills | Status: AC

## 2019-11-14 MED ORDER — IBUPROFEN 800 MG PO TABS: 800.0000 mg | ORAL_TABLET | Freq: Three times a day (TID) | ORAL | 0 refills | Status: AC

## 2019-11-14 NOTE — ED Triage Notes (Signed)
Patient c/o right sided facial pain and swelling, dental pain and headache that started last night. Patient denies fevers.

## 2019-11-14 NOTE — Discharge Instructions (Signed)
Take medications as prescribed  Gargle with LISTERINE ORIGINAL Antiseptic Mouthwash after meals and before bedtime  Follow-up with dentistry as soon as possible. Call today to make an appointment    Happy Mother's Day!  Aldona Bar, FNP-C
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
## 2020-03-08 ENCOUNTER — Telehealth: Payer: Self-pay

## 2020-03-08 NOTE — Telephone Encounter (Signed)
Medical records request completed and mailed records to [facility] [address] [place], [place] [address]. Copy of request held at front.

## 2021-04-13 NOTE — Telephone Encounter (Signed)
Mirena rcvd/Charged 01/20/2019
